# Patient Record
Sex: Female | Born: 1995
Health system: Southern US, Community
[De-identification: ages and names within clinical notes are randomized; demographics above are authoritative.]

## PROBLEM LIST (undated history)

## (undated) DIAGNOSIS — O365990 Maternal care for other known or suspected poor fetal growth, unspecified trimester, not applicable or unspecified: Secondary | ICD-10-CM

## (undated) DIAGNOSIS — O409XX0 Polyhydramnios, unspecified trimester, not applicable or unspecified: Secondary | ICD-10-CM

## (undated) DIAGNOSIS — F329 Major depressive disorder, single episode, unspecified: Secondary | ICD-10-CM

## (undated) DIAGNOSIS — R109 Unspecified abdominal pain: Secondary | ICD-10-CM

## (undated) DIAGNOSIS — T8859XA Other complications of anesthesia, initial encounter: Secondary | ICD-10-CM

## (undated) DIAGNOSIS — F419 Anxiety disorder, unspecified: Secondary | ICD-10-CM

## (undated) DIAGNOSIS — T4145XA Adverse effect of unspecified anesthetic, initial encounter: Secondary | ICD-10-CM

## (undated) DIAGNOSIS — F32A Depression, unspecified: Secondary | ICD-10-CM

## (undated) DIAGNOSIS — J45909 Unspecified asthma, uncomplicated: Secondary | ICD-10-CM

## (undated) HISTORY — PX: ADENOIDECTOMY: SUR15

## (undated) HISTORY — PX: TYMPANOSTOMY TUBE PLACEMENT: SHX32

## (undated) HISTORY — DX: Other complications of anesthesia, initial encounter: T88.59XA

## (undated) HISTORY — DX: Depression, unspecified: F32.A

## (undated) HISTORY — DX: Unspecified abdominal pain: R10.9

---
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
## 2019-03-02 ENCOUNTER — Other Ambulatory Visit: Payer: Self-pay

## 2019-03-02 ENCOUNTER — Ambulatory Visit (INDEPENDENT_AMBULATORY_CARE_PROVIDER_SITE_OTHER): Payer: Medicaid Other | Admitting: Allergy and Immunology

## 2019-03-02 ENCOUNTER — Encounter: Payer: Self-pay | Admitting: Allergy and Immunology

## 2019-03-02 VITALS — BP 136/80 | HR 94 | Temp 98.3°F | Resp 18 | Ht 62.0 in | Wt 178.4 lb

## 2019-03-02 DIAGNOSIS — J4540 Moderate persistent asthma, uncomplicated: Secondary | ICD-10-CM

## 2019-03-02 DIAGNOSIS — Z3A01 Less than 8 weeks gestation of pregnancy: Secondary | ICD-10-CM

## 2019-03-02 DIAGNOSIS — J3089 Other allergic rhinitis: Secondary | ICD-10-CM

## 2019-03-02 MED ORDER — BUDESONIDE 32 MCG/ACT NA SUSP: NASAL | 5 refills | Status: DC

## 2019-03-02 MED ORDER — PULMICORT FLEXHALER 180 MCG/ACT IN AEPB: INHALATION_SPRAY | RESPIRATORY_TRACT | 5 refills | Status: DC

## 2019-03-02 MED ORDER — MONTELUKAST SODIUM 10 MG PO TABS: ORAL_TABLET | ORAL | 5 refills | Status: DC

## 2019-03-02 NOTE — Progress Notes (Signed)
[place] - [place] - [place] - [place] - [place]   Follow-up Note  Referring Provider: Charlene Brooke, MD Primary Provider: Charlene Brooke, MD Date of Office Visit: 03/02/2019  Subjective:   Tracy [name] (DOB: 1995/06/22) is a 23 y.o. female who [facility] in re-evaluation of the following:  HPI: Tracy [name] presents to this clinic in evaluation of breathing problems.  I last saw her in his clinic for asthma and allergic rhinitis and a history of reflux with her last visit being 21 March 2016.  She states that she has really done very well while using a bronchodilator twice a day on a consistent basis for many years along with Singulair.  She states that her nose has been doing pretty well as long as she continues to use Singulair.  About every spring and every fall it sounds as though she gets a steroid injection or prednisone for a respiratory tract flare.  She no longer uses Symbicort or nasal steroid.  Sometime in mid September she developed significant wheezing and coughing which lasted about 5 days or so and fortunately improved but over the course of the past month she has had very consistent wheezing and coughing and shortness of breath and using her bronchodilator multiple times per day and she ended up in the emergency room 5 days ago with these symptoms along with significant posttussive emesis and was treated with a systemic steroid and prednisone.  She is much better at this point in time.  She has not been having any issues with reflux.  She is presently [redacted] weeks pregnant.  She has an appointment to see her gynecologist on 23 March 2019.  Allergies as of 03/02/2019   No Known Allergies     Medication List      ProAir HFA 108 (90 Base) MCG/ACT inhaler Generic drug: albuterol Inhale 2 puffs into the lungs every 4 (four) hours as needed for wheezing or shortness of breath.   albuterol (2.5 MG/3ML) 0.083%  nebulizer solution Commonly known as: PROVENTIL Take 2.5 mg by nebulization every 6 (six) hours as needed for wheezing or shortness of breath.   ketoconazole 2 % shampoo Commonly known as: NIZORAL U SHAMPOO QOD UTD       Past Medical History:  Diagnosis Date  . Abdominal pain   . Anxiety   . Asthma     Past Surgical History:  Procedure Laterality Date  . ADENOIDECTOMY    . ESOPHAGOGASTRODUODENOSCOPY N/A 08/12/2013   Procedure: ESOPHAGOGASTRODUODENOSCOPY (EGD);  Surgeon: Jon Gills, MD;  Location: [facility] ENDOSCOPY;  Service: Endoscopy;  Laterality: N/A;  . TYMPANOSTOMY TUBE PLACEMENT      Review of systems negative except as noted in HPI / PMHx or noted below:  Review of Systems  Constitutional: Negative.   HENT: Negative.   Eyes: Negative.   Respiratory: Negative.   Cardiovascular: Negative.   Gastrointestinal: Negative.   Genitourinary: Negative.   Musculoskeletal: Negative.   Skin: Negative.   Neurological: Negative.   Endo/Heme/Allergies: Negative.   Psychiatric/Behavioral: Negative.      Objective:   Vitals:   03/02/19 1616  BP: 136/80  Pulse: 94  Resp: 18  Temp: 98.3 F (36.8 C)  SpO2: 96%   Height: 5\' 2"  (157.5 cm)  Weight: 178 lb 6.4 oz (80.9 kg)   Physical Exam Constitutional:      Appearance: She is not diaphoretic.  HENT:     Head: Normocephalic.  Right Ear: Tympanic membrane, ear canal and external ear normal.     Left Ear: Tympanic membrane, ear canal and external ear normal.     Nose: Nose normal. No mucosal edema or rhinorrhea.     Mouth/Throat:     Pharynx: Uvula midline. No oropharyngeal exudate.  Eyes:     Conjunctiva/sclera: Conjunctivae normal.  Neck:     Thyroid: No thyromegaly.     Trachea: Trachea normal. No tracheal tenderness or tracheal deviation.  Cardiovascular:     Rate and Rhythm: Normal rate and regular rhythm.     Heart sounds: Normal heart sounds, S1 normal and S2 normal. No murmur.  Pulmonary:     Effort:  No respiratory distress.     Breath sounds: Normal breath sounds. No stridor. No wheezing or rales.  Lymphadenopathy:     Head:     Right side of head: No tonsillar adenopathy.     Left side of head: No tonsillar adenopathy.     Cervical: No cervical adenopathy.  Skin:    Findings: No erythema or rash.     Nails: There is no clubbing.   Neurological:     Mental Status: She is alert.     Diagnostics:    Spirometry was performed and demonstrated an FEV1 of 2.64 at 85 % of predicted.  Assessment and Plan:   1. Not well controlled moderate persistent asthma   2. Perennial allergic rhinitis   3. Less than [redacted] weeks gestation of pregnancy     1.  Start Pulmicort 180-2 inhalations twice a day  2.  Start nasal budesonide-1 spray each nostril 1 time per day  3.  Continue montelukast 10 mg - 1 tablet 1 time per day  4.  If needed:   A. ProAir HFA 2 inhalations or nebulization every 4-6 hours  B.  OTC antihistamine - loratadine 10 mg -1 tablet 1 time per day  5.  Return to clinic in 4 weeks or earlier if problem.  Taper medications?  6.  Obtain flu vaccine as directed by gynecologist  Tracy [name] obviously does not have good control of her asthma as she has been using a short acting bronchodilator twice a day for years and recently required a systemic steroid and a history of history of requiring a systemic steroid at least twice a year.  I am going to treat her with Pulmicort/budesonide for both her upper and lower airway and she can continue on a leukotriene modifier.  I will see her back in his clinic in 4 weeks or earlier if there is a problem.  Tracy Katz, MD Allergy / Immunology [place]

## 2019-03-02 NOTE — Patient Instructions (Addendum)
  1.  Start Pulmicort 180-2 inhalations twice a day  2.  Start nasal budesonide-1 spray each nostril 1 time per day  3.  Continue montelukast 10 mg - 1 tablet 1 time per day  4.  If needed:   A. ProAir HFA 2 inhalations or nebulization every 4-6 hours  B.  OTC antihistamine - loratadine 10 mg -1 tablet 1 time per day  5.  Return to clinic in 4 weeks or earlier if problem.  Taper medications?  6.  Obtain flu vaccine as directed by gynecologist

## 2019-03-03 ENCOUNTER — Encounter: Payer: Self-pay | Admitting: Allergy and Immunology

## 2019-03-04 ENCOUNTER — Telehealth: Payer: Self-pay | Admitting: *Deleted

## 2019-03-04 NOTE — Telephone Encounter (Signed)
Tracy [name] is calling complaining of hives and itching. She denies all other symptoms such as swelling or breathing difficulty.  She states that she has never had hives before and that she started Pulmicort on Wednesday per Dr. Bruna Potter order. She is concerned that this is the cause but I spoke with Dr. Neldon Mc and he believes this is most likely because of her pregnancy. He states that she can take Zyrtec. I have informed Tracy [name] of this and told her to call back if her symptoms change or get worse.

## 2019-03-09 ENCOUNTER — Encounter: Payer: Self-pay | Admitting: *Deleted

## 2019-03-10 ENCOUNTER — Telehealth: Payer: Self-pay | Admitting: Emergency Medicine

## 2019-03-10 NOTE — Telephone Encounter (Signed)
Pt called and left a message on the nurse voicemail line stating that she needs the mychart link resent so she can download it and be ready for her appointment.

## 2019-03-17 NOTE — Telephone Encounter (Signed)
I called [place] and explained her first visit with [place] is a telephone visit ; but I can send her a MyChart text if she would like so she can get her MyChart account started. She states she would like me to send text. I sent text and explained how to set up account and download app.  Mieshia Pepitone,RN

## 2019-03-21 ENCOUNTER — Telehealth: Payer: Self-pay | Admitting: Family Medicine

## 2019-03-21 NOTE — Telephone Encounter (Signed)
Spoke to patient about her appointment on 11/18 @ 8:30. Patient instructed that this visit will be a phone visit and she does not have to come to the office for this appointment. Patient instructed a nurse will be call her around her appointment time. Patient instructed to be available around her appointment. Patient verbalized understanding.

## 2019-03-23 ENCOUNTER — Other Ambulatory Visit: Payer: Self-pay

## 2019-03-23 ENCOUNTER — Ambulatory Visit (INDEPENDENT_AMBULATORY_CARE_PROVIDER_SITE_OTHER): Payer: Medicaid Other | Admitting: *Deleted

## 2019-03-23 DIAGNOSIS — O99210 Obesity complicating pregnancy, unspecified trimester: Secondary | ICD-10-CM

## 2019-03-23 DIAGNOSIS — Z3490 Encounter for supervision of normal pregnancy, unspecified, unspecified trimester: Secondary | ICD-10-CM

## 2019-03-23 DIAGNOSIS — F329 Major depressive disorder, single episode, unspecified: Secondary | ICD-10-CM | POA: Insufficient documentation

## 2019-03-23 DIAGNOSIS — J45909 Unspecified asthma, uncomplicated: Secondary | ICD-10-CM | POA: Insufficient documentation

## 2019-03-23 DIAGNOSIS — F3289 Other specified depressive episodes: Secondary | ICD-10-CM

## 2019-03-23 DIAGNOSIS — F32A Depression, unspecified: Secondary | ICD-10-CM | POA: Insufficient documentation

## 2019-03-23 DIAGNOSIS — F419 Anxiety disorder, unspecified: Secondary | ICD-10-CM | POA: Insufficient documentation

## 2019-03-23 MED ORDER — BLOOD PRESSURE KIT DEVI: 1.0000 | 0 refills | Status: DC | PRN

## 2019-03-23 NOTE — Patient Instructions (Signed)

## 2019-03-23 NOTE — Progress Notes (Signed)
I connected with  Tracy [name] on 03/23/19 at  8:30 AM EST by telephone and verified that I am speaking with the correct person using two identifiers.   I discussed the limitations, risks, security and privacy concerns of performing an evaluation and management service by telephone and the availability of in person appointments. I also discussed with the patient that there may be a patient responsible charge related to this service. The patient expressed understanding and agreed to proceed. Explained I am completing her New OB Intake today. We discussed Her EDD and that it is based on  sure LMP . I reviewed her allergies, meds, OB History, Medical /Surgical history, and appropriate screenings. I explained I will send her the [employer] app- app sent to her while on phone.  I explained we will send a blood pressure cuff to [facility] that will fill that prescription and they  will call her to verify her information. I asked her to bring the blood pressure cuff with her to her first ob appointment so we can show her how to use it. Explained  then we will have her take her blood pressure weekly and enter into the app. Explained she will have some visits in office and some virtually. She already has Community education officer. Reviewed appointment date/ time with her , our location and to wear mask, no visitors. Explained she will have exam, ob bloodwork, hemoglobin a1C, cbg , genetic testing if desired, pap if needed. I scheduled an [place] at 19 weeks and gave her the appointment. She voices understanding.   Linda,RN 03/23/2019  8:27 AM

## 2019-03-30 ENCOUNTER — Other Ambulatory Visit: Payer: Self-pay

## 2019-03-30 ENCOUNTER — Encounter: Payer: Self-pay | Admitting: Allergy and Immunology

## 2019-03-30 ENCOUNTER — Ambulatory Visit (INDEPENDENT_AMBULATORY_CARE_PROVIDER_SITE_OTHER): Payer: Medicaid Other | Admitting: Allergy and Immunology

## 2019-03-30 VITALS — BP 104/60 | HR 88 | Temp 97.2°F | Resp 18

## 2019-03-30 DIAGNOSIS — J4540 Moderate persistent asthma, uncomplicated: Secondary | ICD-10-CM

## 2019-03-30 DIAGNOSIS — J3089 Other allergic rhinitis: Secondary | ICD-10-CM | POA: Diagnosis not present

## 2019-03-30 MED ORDER — ALBUTEROL SULFATE HFA 108 (90 BASE) MCG/ACT IN AERS: INHALATION_SPRAY | RESPIRATORY_TRACT | 1 refills | Status: DC

## 2019-03-30 MED ORDER — ASMANEX HFA 100 MCG/ACT IN AERO: 2.0000 | INHALATION_SPRAY | Freq: Every day | RESPIRATORY_TRACT | 5 refills | Status: DC

## 2019-03-30 MED ORDER — MONTELUKAST SODIUM 10 MG PO TABS: ORAL_TABLET | ORAL | 5 refills | Status: DC

## 2019-03-30 MED ORDER — LORATADINE 10 MG PO TABS: ORAL_TABLET | ORAL | 5 refills | Status: DC

## 2019-03-30 NOTE — Patient Instructions (Signed)
  1.  Start Asmanex HFA 100 -2 inhalations once a day  2.  Continue montelukast 10 mg - 1 tablet 1 time per day  3.  If needed:   A. ProAir HFA 2 inhalations or nebulization every 4-6 hours  B.  OTC antihistamine - loratadine 10 mg -1 tablet 1 time per day  4.  Return to clinic in 12 weeks or earlier if problem

## 2019-03-30 NOTE — Progress Notes (Signed)
[place] - [place] - [place]   Follow-up Note  Referring Provider: Cherene Altes, MD Primary Provider: Cherene Altes, MD Date of Office Visit: 03/30/2019  Subjective:   Tracy [name] (DOB: May 17, 1995) is a 23 y.o. female who returns to the [facility] [place] on 03/30/2019 in re-evaluation of the following:  HPI: Tracy [name] returns to this clinic in reevaluation of asthma in the setting of pregnancy.  I last saw her in this clinic on 02 March 2019 at which point in time she did not appear to have very well controlled asthma and we started her on Pulmicort and for some of his rhinitis issues we started her on Rhinocort.  She states that she had rather significant anxiety when using Pulmicort.  She basically had a panic attack after just a few doses.  Interestingly, around that point in time she developed a diffuse urticarial reaction requiring her to go to the emergency room and received an injection of systemic steroids and a course of prednisone.  As a result of that steroid treatment she has no problems with her breathing at this point.  States she still uses a short acting bronchodilator at nighttime but that is more of a habit than it is her need.  She continues to use montelukast every day.  Allergies as of 03/30/2019   No Known Allergies     Medication List    albuterol 108 (90 Base) MCG/ACT inhaler Commonly known as: ProAir HFA Can inhale two puffs every four to six hours as needed for cough or wheeze.   albuterol (2.5 MG/3ML) 0.083% nebulizer solution Commonly known as: PROVENTIL Take 2.5 mg by nebulization every 6 (six) hours as needed for wheezing or shortness of breath.   Blood Pressure Kit Devi 1 Device by Does not apply route as needed.   Diclegis 10-10 MG Tbec Generic drug: Doxylamine-Pyridoxine Take 1 tablet by mouth 2 (two) times daily.   doxylamine (Sleep) 25 MG tablet Commonly known as: UNISOM Take 25 mg by mouth at  bedtime as needed.   ketoconazole 2 % shampoo Commonly known as: NIZORAL U SHAMPOO QOD UTD   loratadine 10 MG tablet Commonly known as: CLARITIN Can take one tablet by mouth once daily if needed. Started by: Jiles Prows, MD   montelukast 10 MG tablet Commonly known as: SINGULAIR Take 1 tablet by mouth once daily   PRENATAL VITAMIN PO Take 1 tablet by mouth daily.   promethazine 25 MG tablet Commonly known as: PHENERGAN Take 25 mg by mouth every 6 (six) hours as needed for nausea or vomiting. Taking 1/2 tab usually   sertraline 50 MG tablet Commonly known as: ZOLOFT Take 50 mg by mouth daily.       Past Medical History:  Diagnosis Date  . Abdominal pain   . Anxiety   . Asthma   . Complication of anesthesia    panic attack   . Depression     Past Surgical History:  Procedure Laterality Date  . ADENOIDECTOMY    . CHOLECYSTECTOMY  05/2016  . ESOPHAGOGASTRODUODENOSCOPY N/A 08/12/2013   Procedure: ESOPHAGOGASTRODUODENOSCOPY (EGD);  Surgeon: Oletha Blend, MD;  Location: [facility] ENDOSCOPY;  Service: Endoscopy;  Laterality: N/A;  . TYMPANOSTOMY TUBE PLACEMENT      Review of systems negative except as noted in HPI / PMHx or noted below:  Review of Systems  Constitutional: Negative.   HENT: Negative.   Eyes: Negative.   Respiratory: Negative.   Cardiovascular:  Negative.   Gastrointestinal: Negative.   Genitourinary: Negative.   Musculoskeletal: Negative.   Skin: Negative.   Neurological: Negative.   Endo/Heme/Allergies: Negative.   Psychiatric/Behavioral: Negative.      Objective:   Vitals:   03/30/19 1519  BP: 104/60  Pulse: 88  Resp: 18  Temp: (!) 97.2 F (36.2 C)  SpO2: 98%          Physical Exam Constitutional:      Appearance: She is not diaphoretic.  HENT:     Head: Normocephalic.     Right Ear: Tympanic membrane, ear canal and external ear normal.     Left Ear: Tympanic membrane, ear canal and external ear normal.     Nose: Nose normal.  No mucosal edema or rhinorrhea.     Mouth/Throat:     Pharynx: Uvula midline. No oropharyngeal exudate.  Eyes:     Conjunctiva/sclera: Conjunctivae normal.  Neck:     Thyroid: No thyromegaly.     Trachea: Trachea normal. No tracheal tenderness or tracheal deviation.  Cardiovascular:     Rate and Rhythm: Normal rate and regular rhythm.     Heart sounds: Normal heart sounds, S1 normal and S2 normal. No murmur.  Pulmonary:     Effort: No respiratory distress.     Breath sounds: Normal breath sounds. No stridor. No wheezing or rales.  Lymphadenopathy:     Head:     Right side of head: No tonsillar adenopathy.     Left side of head: No tonsillar adenopathy.     Cervical: No cervical adenopathy.  Skin:    Findings: No erythema or rash.     Nails: There is no clubbing.   Neurological:     Mental Status: She is alert.     Diagnostics:    Spirometry was performed and demonstrated an FEV1 of 2.57 at 83 % of predicted.  The patient had an Asthma Control Test with the following results: ACT Total Score: 20.    Assessment and Plan:   1. Not well controlled moderate persistent asthma   2. Perennial allergic rhinitis     1.  Start Asmanex HFA 100 -2 inhalations once a day  2.  Continue montelukast 10 mg - 1 tablet 1 time per day  3.  If needed:   A. ProAir HFA 2 inhalations or nebulization every 4-6 hours  B.  OTC antihistamine - loratadine 10 mg -1 tablet 1 time per day  4.  Return to clinic in 12 weeks or earlier if problem  Tracy [name] appears to be doing quite well at this point time regarding her airway.  Of course, this occurs in the context of receiving systemic steroids from the emergency room for her pruritic disorder which fortunately is not active at this point.  I would like for her to use a preventative controller agent for her asthma and I given her a sample of Asmanex to use.  We will see how things go over the course of the next 12 weeks while utilizing this agent.   Allena Katz, MD Allergy / Immunology [place]

## 2019-03-30 NOTE — BH Specialist Note (Signed)
Integrated Behavioral Health via Telemedicine Video Visit  03/30/2019 ADALEENA [name] [id number]  Number of Integrated Behavioral Health visits: 1 Session Start time: 8:21  Session End time: 8:42 Total time: 21  Referring Provider: Thressa Sheller, CNM Type of Visit: Video Patient/Family location: Home [facility] Provider location: [facility] All persons participating in visit: Patient Tracy [name] and [facility] Tracy [name]    Confirmed patient's address: Yes  Confirmed patient's phone number: Yes  Any changes to demographics: No   Confirmed patient's insurance: Yes  Any changes to patient's insurance: No   Discussed confidentiality: Yes   I connected with [name] by a video enabled telemedicine application and verified that I am speaking with the correct person using two identifiers.     I discussed the limitations of evaluation and management by telemedicine and the availability of in person appointments.  I discussed that the purpose of this visit is to provide behavioral health care while limiting exposure to the novel coronavirus.   Discussed there is a possibility of technology failure and discussed alternative modes of communication if that failure occurs.  I discussed that engaging in this video visit, they consent to the provision of behavioral healthcare and the services will be billed under their insurance.  Patient and/or legal guardian expressed understanding and consented to video visit: Yes   PRESENTING CONCERNS: Patient and/or family reports the following symptoms/concerns: Pt states her primary concern today is escalating anxiety in pregnancy, attributed to feeling sick with nausea first trimester. Pt will "wake up with stomach in knots" which triggers feelings of panic. Pt is currently attending [facility] therapy monthly, is taking Zoloft.  Duration of problem: Increase in pregnancy; Severity of problem: severe  STRENGTHS (Protective Factors/Coping  Skills): Attending therapy, taking BH medication, open to learning new skills  GOALS ADDRESSED: Patient will: 1.  Reduce symptoms of: anxiety and depression  2.  Increase knowledge and/or ability of: coping skills  3.  Demonstrate ability to: Increase healthy adjustment to current life circumstances  INTERVENTIONS: Interventions utilized:  Mindfulness or Management consultant and Psychoeducation and/or Health Education Standardized Assessments completed: Took in past week  ASSESSMENT: Patient currently experiencing Adjustment disorder with mixed anxious and depressed mood.   Patient may benefit from psychoeducation and brief therapeutic interventions regarding coping with symptoms of anxiety and depression .  PLAN: 1. Follow up with behavioral health clinician on : One month (to check symptoms after nausea should decrease) 2. Behavioral recommendations:  -Continue attending routine therapy at [facility] -Continue taking Zoloft and prenatal vitamins as prescribed by medical provider -Consider using CALM relaxation breathing exercise prior to bedtime and upon waking to help prevent and reduce tension in body triggering panic 3. Referral(s): Integrated [employer] (In Clinic)  I discussed the assessment and treatment plan with the patient and/or parent/guardian. They were provided an opportunity to ask questions and all were answered. They agreed with the plan and demonstrated an understanding of the instructions.   They were advised to call back or seek an in-person evaluation if the symptoms worsen or if the condition fails to improve as anticipated.  Tracy [name] Tracy [name]  Depression screen [facility] 2/9 04/04/2019 03/23/2019  Decreased Interest 1 1  Down, Depressed, Hopeless 1 1  PHQ - 2 Score 2 2  Altered sleeping 3 3  Tired, decreased energy 3 3  Change in appetite 2 1  Feeling bad or failure about yourself  0 0  Trouble concentrating 0 1  Moving slowly or fidgety/restless  0 0  Suicidal thoughts 0 0  PHQ-9 Score 10 10   GAD 7 : Generalized Anxiety Score 04/04/2019 03/23/2019  Nervous, Anxious, on Edge 3 3  Control/stop worrying 3 1  Worry too much - different things 3 1  Trouble relaxing 3 1  Restless 3 1  Easily annoyed or irritable 3 3  Afraid - awful might happen 3 1  Total GAD 7 Score 21 11

## 2019-04-04 ENCOUNTER — Telehealth: Payer: Self-pay

## 2019-04-04 ENCOUNTER — Encounter: Payer: Self-pay | Admitting: Allergy and Immunology

## 2019-04-04 ENCOUNTER — Ambulatory Visit (INDEPENDENT_AMBULATORY_CARE_PROVIDER_SITE_OTHER): Payer: Medicaid Other | Admitting: Advanced Practice Midwife

## 2019-04-04 ENCOUNTER — Other Ambulatory Visit: Payer: Self-pay

## 2019-04-04 ENCOUNTER — Other Ambulatory Visit (HOSPITAL_COMMUNITY)
Admission: RE | Admit: 2019-04-04 | Discharge: 2019-04-04 | Disposition: A | Discharge status: Home / Self Care | Payer: Medicaid Other | Source: Ambulatory Visit | Attending: Advanced Practice Midwife | Admitting: Advanced Practice Midwife

## 2019-04-04 VITALS — BP 124/77 | HR 88 | Wt 175.5 lb

## 2019-04-04 DIAGNOSIS — F419 Anxiety disorder, unspecified: Secondary | ICD-10-CM

## 2019-04-04 DIAGNOSIS — J45909 Unspecified asthma, uncomplicated: Secondary | ICD-10-CM

## 2019-04-04 DIAGNOSIS — Z3A11 11 weeks gestation of pregnancy: Secondary | ICD-10-CM

## 2019-04-04 DIAGNOSIS — F1210 Cannabis abuse, uncomplicated: Secondary | ICD-10-CM | POA: Insufficient documentation

## 2019-04-04 DIAGNOSIS — O99341 Other mental disorders complicating pregnancy, first trimester: Secondary | ICD-10-CM

## 2019-04-04 DIAGNOSIS — Z3490 Encounter for supervision of normal pregnancy, unspecified, unspecified trimester: Secondary | ICD-10-CM | POA: Insufficient documentation

## 2019-04-04 DIAGNOSIS — Z23 Encounter for immunization: Secondary | ICD-10-CM | POA: Diagnosis not present

## 2019-04-04 DIAGNOSIS — Z3491 Encounter for supervision of normal pregnancy, unspecified, first trimester: Secondary | ICD-10-CM

## 2019-04-04 DIAGNOSIS — O99211 Obesity complicating pregnancy, first trimester: Secondary | ICD-10-CM

## 2019-04-04 DIAGNOSIS — O99210 Obesity complicating pregnancy, unspecified trimester: Secondary | ICD-10-CM

## 2019-04-04 MED ORDER — PYRIDOXINE HCL 25 MG PO TABS: 25.0000 mg | ORAL_TABLET | Freq: Three times a day (TID) | ORAL | 0 refills | Status: DC

## 2019-04-04 MED ORDER — DOXYLAMINE SUCCINATE (SLEEP) 25 MG PO TABS: 25.0000 mg | ORAL_TABLET | Freq: Every evening | ORAL | 3 refills | Status: DC | PRN

## 2019-04-04 MED ORDER — ASPIRIN EC 81 MG PO TBEC: 81.0000 mg | DELAYED_RELEASE_TABLET | Freq: Every day | ORAL | 2 refills | Status: DC

## 2019-04-04 NOTE — Telephone Encounter (Signed)
Faxed documentation to [facility] [employer] for prior authorization of Asmanex 100 HFA.  Awaiting response.

## 2019-04-04 NOTE — Patient Instructions (Signed)
Second Trimester of Pregnancy  The second trimester is from week 14 through week 27 (month 4 through 6). This is often the time in pregnancy that you feel your best. Often times, morning sickness has lessened or quit. You may have more energy, and you may get hungry more often. Your unborn baby is growing rapidly. At the end of the sixth month, he or she is about 9 inches long and weighs about 1 pounds. You will likely feel the baby move between 18 and 20 weeks of pregnancy.   Follow these instructions at home: Medicines  Take over-the-counter and prescription medicines only as told by your doctor. Some medicines are safe and some medicines are not safe during pregnancy.  Take a prenatal vitamin that contains at least 600 micrograms (mcg) of folic acid.  If you have trouble pooping (constipation), take medicine that will make your stool soft (stool softener) if your doctor approves. Eating and drinking   Eat regular, healthy meals.  Avoid raw meat and uncooked cheese.  If you get low calcium from the food you eat, talk to your doctor about taking a daily calcium supplement.  Avoid foods that are high in fat and sugars, such as fried and sweet foods.  If you feel sick to your stomach (nauseous) or throw up (vomit): ? Eat 4 or 5 small meals a day instead of 3 large meals. ? Try eating a few soda crackers. ? Drink liquids between meals instead of during meals.  To prevent constipation: ? Eat foods that are high in fiber, like fresh fruits and vegetables, whole grains, and beans. ? Drink enough fluids to keep your pee (urine) clear or pale yellow. Activity  Exercise only as told by your doctor. Stop exercising if you start to have cramps.  Do not exercise if it is too hot, too humid, or if you are in a place of great height (high altitude).  Avoid heavy lifting.  Wear low-heeled shoes. Sit and stand up straight.  You can continue to have sex unless your doctor tells you not to.  Relieving pain and discomfort  Wear a good support bra if your breasts are tender.  Take warm water baths (sitz baths) to soothe pain or discomfort caused by hemorrhoids. Use hemorrhoid cream if your doctor approves.  Rest with your legs raised if you have leg cramps or low back pain.  If you develop puffy, bulging veins (varicose veins) in your legs: ? Wear support hose or compression stockings as told by your doctor. ? Raise (elevate) your feet for 15 minutes, 3-4 times a day. ? Limit salt in your food. Prenatal care  Write down your questions. Take them to your prenatal visits.  Keep all your prenatal visits as told by your doctor. This is important. Safety  Wear your seat belt when driving.  Make a list of emergency phone numbers, including numbers for family, friends, the hospital, and police and fire departments. General instructions  Ask your doctor about the right foods to eat or for help finding a counselor, if you need these services.  Ask your doctor about local prenatal classes. Begin classes before month 6 of your pregnancy.  Do not use hot tubs, steam rooms, or saunas.  Do not douche or use tampons or scented sanitary pads.  Do not cross your legs for long periods of time.  Visit your dentist if you have not done so. Use a soft toothbrush to brush your teeth. Floss gently.  Avoid all  smoking, herbs, and alcohol. Avoid drugs that are not approved by your doctor.  Do not use any products that contain nicotine or tobacco, such as cigarettes and e-cigarettes. If you need help quitting, ask your doctor.  Avoid cat litter boxes and soil used by cats. These carry germs that can cause birth defects in the baby and can cause a loss of your baby (miscarriage) or stillbirth. Contact a doctor if:  You have mild cramps or pressure in your lower belly.  You have pain when you pee (urinate).  You have bad smelling fluid coming from your vagina.  You continue to feel  sick to your stomach (nauseous), throw up (vomit), or have watery poop (diarrhea).  You have a nagging pain in your belly area.  You feel dizzy. Get help right away if:  You have a fever.  You are leaking fluid from your vagina.  You have spotting or bleeding from your vagina.  You have severe belly cramping or pain.  You lose or gain weight rapidly.  You have trouble catching your breath and have chest pain.  You notice sudden or extreme puffiness (swelling) of your face, hands, ankles, feet, or legs.  You have not felt the baby move in over an hour.  You have severe headaches that do not go away when you take medicine.  You have trouble seeing. Summary  The second trimester is from week 14 through week 27 (months 4 through 6). This is often the time in pregnancy that you feel your best.  To take care of yourself and your unborn baby, you will need to eat healthy meals, take medicines only if your doctor tells you to do so, and do activities that are safe for you and your baby.  Call your doctor if you get sick or if you notice anything unusual about your pregnancy. Also, call your doctor if you need help with the right food to eat, or if you want to know what activities are safe for you. This information is not intended to replace advice given to you by your health care provider. Make sure you discuss any questions you have with your health care provider. Document Released: 07/16/2009 Document Revised: 08/13/2018 Document Reviewed: 05/27/2016 Elsevier Patient Education  [place] Medications in Pregnancy   Acne: Benzoyl Peroxide Salicylic Acid  Backache/Headache: Tylenol: 2 regular strength every 4 hours OR              2 Extra strength every 6 hours  Colds/Coughs/Allergies: Benadryl (alcohol free) 25 mg every 6 hours as needed Breath right strips Claritin Cepacol throat lozenges Chloraseptic throat spray Cold-Eeze- up to three times per day Cough  drops, alcohol free Flonase (by prescription only) Guaifenesin Mucinex Robitussin DM (plain only, alcohol free) Saline nasal spray/drops Sudafed (pseudoephedrine) & Actifed ** use only after [redacted] weeks gestation and if you do not have high blood pressure Tylenol Vicks Vaporub Zinc lozenges Zyrtec   Constipation: Colace Ducolax suppositories Fleet enema Glycerin suppositories Metamucil Milk of magnesia Miralax Senokot Smooth move tea  Diarrhea: Kaopectate Imodium A-D  *NO pepto Bismol  Hemorrhoids: Anusol Anusol HC Preparation H Tucks  Indigestion: Tums Maalox Mylanta Zantac  Pepcid  Insomnia: Benadryl (alcohol free) 25mg  every 6 hours as needed Tylenol PM Unisom, no Gelcaps  Leg Cramps: Tums MagGel  Nausea/Vomiting:  Bonine Dramamine Emetrol Ginger extract Sea bands Meclizine  Nausea medication to take during pregnancy:  Unisom (doxylamine succinate 25 mg tablets) Take one tablet daily at  bedtime. If symptoms are not adequately controlled, the dose can be increased to a maximum recommended dose of two tablets daily (1/2 tablet in the morning, 1/2 tablet mid-afternoon and one at bedtime). Vitamin B6 100mg  tablets. Take one tablet twice a day (up to 200 mg per day).  Skin Rashes: Aveeno products Benadryl cream or 25mg  every 6 hours as needed Calamine Lotion 1% cortisone cream  Yeast infection: Gyne-lotrimin 7 Monistat 7   **If taking multiple medications, please check labels to avoid duplicating the same active ingredients **take medication as directed on the label ** Do not exceed 4000 mg of tylenol in 24 hours **Do not take medications that contain aspirin or ibuprofen

## 2019-04-04 NOTE — Progress Notes (Signed)
History:   KONYA FAUBLE is a 23 y.o. Y8M5 at 97w6dby certain LMP being seen today for her first obstetrical visit.  Her obstetrical history is significant for obesity and THC use. Patient does intend to breast feed. Pregnancy history fully reviewed.  Patient reports recurrent nausea which she is managing with Diclegis and Phenergan. She also endorses new onset mild abdominal cramping which began Saturday 04/02/19 and resolved without intervention.  Patient is tearful throughout her appointment today. She states she has preexisting anxiety and the sense of feeling overwhelmed by this unplanned pregnancy is causing her significant stress.  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 This is a reasonably new position which she started about two months ago. She lives with her husband CDarrick Meigs who is very excited about the pregnancy and supportive. She denies SI, HI, IPV.  Patient endorses chronic THC use which she uses for anxiety management. She is aware of Cannabis Hyperemesis and states she has been trying to limit her use due to her recurrent nausea.      HISTORY: OB History  Gravida Para Term Preterm AB Living  1 0 0 0 0 0  SAB TAB Ectopic Multiple Live Births  0 0 0 0 0    # Outcome Date GA Lbr Len/2nd Weight Sex Delivery Anes PTL Lv  1 Current              Past Medical History:  Diagnosis Date  . Abdominal pain   . Anxiety   . Asthma   . Complication of anesthesia    panic attack   . Depression    Past Surgical History:  Procedure Laterality Date  . ADENOIDECTOMY    . CHOLECYSTECTOMY  05/2016  . ESOPHAGOGASTRODUODENOSCOPY N/A 08/12/2013   Procedure: ESOPHAGOGASTRODUODENOSCOPY (EGD);  Surgeon: JOletha Blend MD;  [facility];  Service: Endoscopy;  Laterality: N/A;  . TYMPANOSTOMY TUBE PLACEMENT     Family History  Problem Relation Age of Onset  . Nephrolithiasis Father   . Depression Father   . Depression Mother   . Anxiety disorder  Mother   . Drug abuse Mother   . Celiac disease Neg Hx   . Ulcers Neg Hx   . Cholelithiasis Neg Hx    Social History   Tobacco Use  . Smoking status: Never Smoker  . Smokeless tobacco: Never Used  Substance Use Topics  . Alcohol use: Not Currently    Comment: rarely  . Drug use: No   No Known Allergies Current Outpatient Medications on File Prior to Visit  Medication Sig Dispense Refill  . albuterol (PROAIR HFA) 108 (90 Base) MCG/ACT inhaler Can inhale two puffs every four to six hours as needed for cough or wheeze. 18 g 1  . albuterol (PROVENTIL) (2.5 MG/3ML) 0.083% nebulizer solution Take 2.5 mg by nebulization every 6 (six) hours as needed for wheezing or shortness of breath.    . Doxylamine-Pyridoxine (DICLEGIS) 10-10 MG TBEC Take 1 tablet by mouth 2 (two) times daily.    .Marland Kitchenketoconazole (NIZORAL) 2 % shampoo U SHAMPOO QOD UTD  5  . montelukast (SINGULAIR) 10 MG tablet Take 1 tablet by mouth once daily 30 tablet 5  . Prenatal Vit-Fe Fumarate-FA (PRENATAL VITAMIN PO) Take 1 tablet by mouth daily.    . promethazine (PHENERGAN) 25 MG tablet Take 25 mg by mouth every 6 (six) hours as needed for nausea or vomiting. Taking 1/2 tab usually    . sertraline (ZOLOFT) 50  MG tablet Take 50 mg by mouth daily.    . Blood Pressure Monitoring (BLOOD PRESSURE KIT) DEVI 1 Device by Does not apply route as needed. (Patient not taking: Reported on 04/04/2019) 1 each 0  . loratadine (CLARITIN) 10 MG tablet Can take one tablet by mouth once daily if needed. (Patient not taking: Reported on 04/04/2019) 30 tablet 5  . Mometasone Furoate (ASMANEX HFA) 100 MCG/ACT AERO Inhale 2 puffs into the lungs daily. Rinse, gargle, and spit after use. (Patient not taking: Reported on 04/04/2019) 13 g 5   Current Facility-Administered Medications on File Prior to Visit  Medication Dose Route Frequency Provider Last Rate Last Dose  . Benralizumab SOSY 30 mg  30 mg Subcutaneous Q8 Weeks Kozlow, Donnamarie Poag, MD         Review of Systems Pertinent items noted in HPI and remainder of comprehensive ROS otherwise negative. Physical Exam:   Vitals:   04/04/19 0850  BP: 124/77  Pulse: 88  Weight: 175 lb 8 oz (79.6 kg)   Fetal Heart Rate (bpm): 159 Uterus:     Pelvic Exam: Perineum: no hemorrhoids, normal perineum   Vulva: normal external genitalia, no lesions   Vagina:  normal mucosa, normal discharge   Cervix: no lesions and normal, pap smear done.    Adnexa: normal adnexa and no mass, fullness, tenderness   Bony Pelvis: average  System: General: well-developed, well-nourished female in no acute distress   Breasts:  normal appearance, no masses or tenderness bilaterally   Skin: normal coloration and turgor, no rashes   Neurologic: oriented, normal, negative, normal mood   Extremities: normal strength, tone, and muscle mass, ROM of all joints is normal   HEENT PERRLA, extraocular movement intact and sclera clear, anicteric   Mouth/Teeth mucous membranes moist, pharynx normal without lesions and dental hygiene good   Neck supple and no masses   Cardiovascular: regular rate and rhythm   Respiratory:  no respiratory distress, normal breath sounds   Abdomen: soft, non-tender; bowel sounds normal; no masses,  no organomegaly     Assessment:    Pregnancy: G1P0 Patient Active Problem List   Diagnosis Date Noted  . Supervision of low-risk pregnancy 03/23/2019  . Obesity in pregnancy, antepartum 03/23/2019  . Depression   . Anxiety   . Asthma   . Allergic rhinoconjunctivitis 03/22/2015  . Moderate persistent asthma 03/22/2015  . Nausea alone 07/07/2013  . Substernal pain 07/06/2013  . Right sided abdominal pain      Indications for ASA therapy (per uptodate)  Two or more of the following: Nulliparity Yes Obesity (body mass index >30 kg/m2) Yes Family history of preeclampsia in mother or sister No Age ?35 years No Sociodemographic characteristics (African American race, low socioeconomic  level) Yes Personal risk factors (eg, previous pregnancy with low birth weight or small for gestational age infant, previous adverse pregnancy outcome [eg, stillbirth], interval >10 years between pregnancies) No  Indications for early 1 hour GTT (per uptodate)  BMI >25 (>23 in Asian women) AND one of the following  Gestational diabetes mellitus in a previous pregnancy No Glycated hemoglobin ?5.7 percent (39 mmol/mol), impaired glucose tolerance, or impaired fasting glucose on previous testing No First-degree relative with diabetes No High-risk race/ethnicity (eg, African American, Latino, Native American, [place] American, [place] Islander) No History of cardiovascular disease No Hypertension or on therapy for hypertension No High-density lipoprotein cholesterol level <35 mg/dL (0.90 mmol/L) and/or a triglyceride level >250 mg/dL (2.82 mmol/L) No Polycystic ovary syndrome No  Physical inactivity Yes Other clinical condition associated with insulin resistance (eg, severe obesity, acanthosis nigricans) No Previous birth of an infant weighing ?4000 g No Previous stillbirth of unknown cause No  Plan:    1. Encounter for supervision of low-risk pregnancy, antepartum - Welcomed to practice - Reassured that practice has resources to help patient manage her anxiety - Moved from previously prescribed Diclegis to B6+ Unisom - Continue Phenergan PRN - Culture, OB Urine - Obstetric Panel, Including HIV - Cytology - PAP( [facility]) - Genetic Screening - pyridOXINE (VITAMIN B-6) 25 MG tablet; Take 1 tablet (25 mg total) by mouth every 8 (eight) hours.  Dispense: 30 tablet; Refill: 0 - doxylamine, Sleep, (UNISOM) 25 MG tablet; Take 1 tablet (25 mg total) by mouth at bedtime as needed.  Dispense: 30 tablet; Refill: 3  2. Obesity in pregnancy - Target total weight gain 11-20 lbs - Protein / creatinine ratio, urine - Comprehensive metabolic panel - HgB L9F - aspirin EC 81 MG tablet; Take 1 tablet  (81 mg total) by mouth daily. Take after 12 weeks for prevention of preeclampsia later in pregnancy. Start taking daily on 04/05/2019  Dispense: 300 tablet; Refill: 2  3. Anxiety - Appointment with [facility] tomorrow 04/05/19 - Preemptive discussion of Vistaril as possible part of care plan PRN in pregnancy  4. Uncomplicated asthma, unspecified asthma severity, unspecified whether persistent - S/p visit with Allergy and Immunology 03/30/19 - No complaints or concerns today  5. Need for influenza vaccination  - Flu Vaccine QUAD 36+ mos IM  Initial labs drawn. Continue prenatal vitamins. Genetic Screening discussed, First trimester screen, Quad screen and NIPS: ordered. Ultrasound discussed; fetal anatomic survey: ordered. Problem list reviewed and updated. The nature of [facility] with multiple MDs and other Advanced Practice Providers was explained to patient; also emphasized that residents, students are part of our team. Routine obstetric precautions reviewed. Return in about 1 week (around 04/11/2019) for Return in about one week for early Glucola Return around 19 weeks for Virtual LOB.    Total visit time 45 minutes. Greater than 50% of visit spent in counseling and coordination of care  Mallie Snooks, MSN, CNM Certified Nurse Midwife, [employer] for [employer], [place] 04/04/19 10:02 AM

## 2019-04-04 NOTE — Progress Notes (Signed)
Medicaid home form completed at today's visit.   Kramer Hanrahan RN 04/04/19 

## 2019-04-05 ENCOUNTER — Ambulatory Visit (INDEPENDENT_AMBULATORY_CARE_PROVIDER_SITE_OTHER): Payer: Medicaid Other | Admitting: Clinical

## 2019-04-05 ENCOUNTER — Encounter: Payer: Self-pay | Admitting: *Deleted

## 2019-04-05 ENCOUNTER — Encounter: Payer: Self-pay | Admitting: Advanced Practice Midwife

## 2019-04-05 DIAGNOSIS — Z283 Underimmunization status: Secondary | ICD-10-CM | POA: Insufficient documentation

## 2019-04-05 DIAGNOSIS — F4323 Adjustment disorder with mixed anxiety and depressed mood: Secondary | ICD-10-CM

## 2019-04-05 DIAGNOSIS — Z2839 Other underimmunization status: Secondary | ICD-10-CM | POA: Insufficient documentation

## 2019-04-05 DIAGNOSIS — O09899 Supervision of other high risk pregnancies, unspecified trimester: Secondary | ICD-10-CM | POA: Insufficient documentation

## 2019-04-05 LAB — OBSTETRIC PANEL, INCLUDING HIV
Antibody Screen: NEGATIVE
Basophils Absolute: 0.1 10*3/uL (ref 0.0–0.2)
Basos: 1 %
EOS (ABSOLUTE): 0.3 10*3/uL (ref 0.0–0.4)
Eos: 2 %
HIV Screen 4th Generation wRfx: NONREACTIVE
Hematocrit: 40.7 % (ref 34.0–46.6)
Hemoglobin: 13.4 g/dL (ref 11.1–15.9)
Hepatitis B Surface Ag: NEGATIVE
Immature Grans (Abs): 0 10*3/uL (ref 0.0–0.1)
Immature Granulocytes: 0 %
Lymphocytes Absolute: 2.9 10*3/uL (ref 0.7–3.1)
Lymphs: 20 %
MCH: 29.1 pg (ref 26.6–33.0)
MCHC: 32.9 g/dL (ref 31.5–35.7)
MCV: 88 fL (ref 79–97)
Monocytes Absolute: 0.9 10*3/uL (ref 0.1–0.9)
Monocytes: 6 %
Neutrophils Absolute: 10.2 10*3/uL — ABNORMAL HIGH (ref 1.4–7.0)
Neutrophils: 71 %
Platelets: 270 10*3/uL (ref 150–450)
RBC: 4.61 x10E6/uL (ref 3.77–5.28)
RDW: 13.2 % (ref 11.7–15.4)
RPR Ser Ql: NONREACTIVE
Rh Factor: POSITIVE
Rubella Antibodies, IGG: <0.9 index — ABNORMAL LOW (ref >0.99)
WBC: 14.3 10*3/uL — ABNORMAL HIGH (ref 3.4–10.8)

## 2019-04-05 LAB — COMPREHENSIVE METABOLIC PANEL
ALT: 20 IU/L (ref 0–32)
AST: 15 IU/L (ref 0–40)
Albumin/Globulin Ratio: 1.6 (ref 1.2–2.2)
Albumin: 4.1 g/dL (ref 3.9–5.0)
Alkaline Phosphatase: 80 IU/L (ref 39–117)
BUN/Creatinine Ratio: 9 (ref 9–23)
BUN: 5 mg/dL — ABNORMAL LOW (ref 6–20)
Bilirubin Total: 0.3 mg/dL (ref 0.0–1.2)
CO2: 18 mmol/L — ABNORMAL LOW (ref 20–29)
Calcium: 9.4 mg/dL (ref 8.7–10.2)
Chloride: 98 mmol/L (ref 96–106)
Creatinine, Ser: 0.55 mg/dL — ABNORMAL LOW (ref 0.57–1.00)
GFR calc Af Amer: 155 mL/min/{1.73_m2} (ref >59)
GFR calc non Af Amer: 135 mL/min/{1.73_m2} (ref >59)
Globulin, Total: 2.5 g/dL (ref 1.5–4.5)
Glucose: 83 mg/dL (ref 65–99)
Potassium: 3.7 mmol/L (ref 3.5–5.2)
Sodium: 138 mmol/L (ref 134–144)
Total Protein: 6.6 g/dL (ref 6.0–8.5)

## 2019-04-05 LAB — PROTEIN / CREATININE RATIO, URINE
Creatinine, Urine: 200 mg/dL
Protein, Ur: 12 mg/dL
Protein/Creat Ratio: 60 mg/g creat (ref 0–200)

## 2019-04-05 LAB — CYTOLOGY - PAP
Chlamydia: NEGATIVE
Comment: NEGATIVE
Comment: NORMAL
Diagnosis: NEGATIVE
Neisseria Gonorrhea: NEGATIVE

## 2019-04-05 LAB — HEMOGLOBIN A1C
Est. average glucose Bld gHb Est-mCnc: 100 mg/dL
Hgb A1c MFr Bld: 5.1 % (ref 4.8–5.6)

## 2019-04-06 ENCOUNTER — Encounter: Payer: Medicaid Other | Admitting: Advanced Practice Midwife

## 2019-04-06 ENCOUNTER — Other Ambulatory Visit: Payer: Self-pay

## 2019-04-06 LAB — URINE CULTURE, OB REFLEX: Organism ID, Bacteria: NO GROWTH

## 2019-04-06 LAB — CULTURE, OB URINE

## 2019-04-06 MED ORDER — FLOVENT HFA 44 MCG/ACT IN AERO: INHALATION_SPRAY | RESPIRATORY_TRACT | 5 refills | Status: DC

## 2019-04-06 NOTE — Telephone Encounter (Signed)
PA for Asmanex 100 HFA was denied. Is there another alternative you would like patient to try?

## 2019-04-06 NOTE — Telephone Encounter (Signed)
Patient informed.  Will send in erx for Flovent as requested.

## 2019-04-06 NOTE — Telephone Encounter (Signed)
Please document that insurance denied use of  mometasone.  Have her start Flovent 44 2 inhalations twice a day.

## 2019-04-11 ENCOUNTER — Other Ambulatory Visit: Payer: Self-pay

## 2019-04-11 ENCOUNTER — Other Ambulatory Visit: Payer: Medicaid Other

## 2019-04-11 DIAGNOSIS — Z3490 Encounter for supervision of normal pregnancy, unspecified, unspecified trimester: Secondary | ICD-10-CM

## 2019-04-12 ENCOUNTER — Encounter (HOSPITAL_COMMUNITY): Payer: Self-pay

## 2019-04-12 LAB — GLUCOSE TOLERANCE, 2 HOURS W/ 1HR
Glucose, 1 hour: 112 mg/dL (ref 65–179)
Glucose, 2 hour: 56 mg/dL — ABNORMAL LOW (ref 65–152)
Glucose, Fasting: 79 mg/dL (ref 65–91)

## 2019-04-18 ENCOUNTER — Encounter: Payer: Self-pay | Admitting: General Practice

## 2019-04-21 NOTE — BH Specialist Note (Addendum)
Integrated Behavioral Health via Telemedicine Phone Visit  04/21/2019 Tracy [name] [id number]  Number of Integrated Behavioral Health visits: 2 Session Start time: 1:19  Session End time: 1:28 Total time: 9  Referring Provider: Thressa [name], CNM Type of Visit: Phone Patient/Family location: Home [facility] Provider location: [facility] All persons participating in visit: Patient Tracy [name] and Tracy [name]    Confirmed patient's address: Yes  Confirmed patient's phone number: Yes  Any changes to demographics: No   Confirmed patient's insurance: Yes  Any changes to patient's insurance: No   Discussed confidentiality: At previous visit  I connected with Tracy [name]  by a phone enabled telemedicine application and verified that I am speaking with the correct person using two identifiers.     I discussed the limitations of evaluation and management by telemedicine and the availability of in person appointments.  I discussed that the purpose of this visit is to provide behavioral health care while limiting exposure to the novel coronavirus.   Discussed there is a possibility of technology failure and discussed alternative modes of communication if that failure occurs.  I discussed that engaging in this video visit, they consent to the provision of behavioral healthcare and the services will be billed under their insurance.  Patient and/or legal guardian expressed understanding and consented to phone visit: Yes   PRESENTING CONCERNS: Patient and/or family reports the following symptoms/concerns: Pt states her primary symptoms today are fatigue and lack of quality sleep, nausea has gone away, she is still attending [facility] for therapy and BH medication management, and is now taking Zoloft, Buspar and Vistaril.  Duration of problem: Increase in pregnancy; Severity of problem: moderate  STRENGTHS (Protective Factors/Coping Skills): Attending therapy, taking BH medication as  prescribed, implementing self-coping strategies  GOALS ADDRESSED: Patient will: 1.  Reduce symptoms of: anxiety and depression  2.  Demonstrate ability to: Increase healthy adjustment to current life circumstances  INTERVENTIONS: Interventions utilized:  Supportive Counseling Standardized Assessments completed: GAD-7 and PHQ 9  ASSESSMENT: Patient currently experiencing Adjustment disorder with mixed anxious and depressed mood.   Patient may benefit from continued psychoeducation and brief therapeutic interventions regarding coping with symptoms of anxiety and depression .  PLAN: 1. Follow up with behavioral health clinician on : As needed 2. Behavioral recommendations:  -Continue attending therapy sessions at [facility] -Continue taking BH medications and  prenatal vitamins as prescribed -Continue using self-coping strategies daily, for as long as remain helpful 3. Referral(s): Integrated [facility] (In Clinic)  I discussed the assessment and treatment plan with the patient and/or parent/guardian. They were provided an opportunity to ask questions and all were answered. They agreed with the plan and demonstrated an understanding of the instructions.   They were advised to call back or seek an in-person evaluation if the symptoms worsen or if the condition fails to improve as anticipated.  Tracy [name]  Depression screen [facility] 2/9 05/03/2019 04/04/2019 03/23/2019  Decreased Interest 2 1 1   Down, Depressed, Hopeless 0 1 1  PHQ - 2 Score 2 2 2   Altered sleeping 3 3 3   Tired, decreased energy 3 3 3   Change in appetite 0 2 1  Feeling bad or failure about yourself  0 0 0  Trouble concentrating 0 0 1  Moving slowly or fidgety/restless 0 0 0  Suicidal thoughts 0 0 0  PHQ-9 Score 8 10 10    GAD 7 : Generalized Anxiety Score 05/03/2019 04/04/2019 03/23/2019  Nervous, Anxious, on Edge 2  3 3  Control/stop worrying 2 3 1   Worry too much - different things 2 3 1    Trouble relaxing 1 3 1   Restless 1 3 1   Easily annoyed or irritable 1 3 3   Afraid - awful might happen 1 3 1   Total GAD 7 Score 10 21 11

## 2019-05-03 ENCOUNTER — Other Ambulatory Visit: Payer: Self-pay

## 2019-05-03 ENCOUNTER — Ambulatory Visit (INDEPENDENT_AMBULATORY_CARE_PROVIDER_SITE_OTHER): Payer: Medicaid Other | Admitting: Clinical

## 2019-05-03 DIAGNOSIS — F4323 Adjustment disorder with mixed anxiety and depressed mood: Secondary | ICD-10-CM | POA: Diagnosis present

## 2019-05-06 NOTE — L&D Delivery Note (Addendum)
OB/GYN Faculty Practice Delivery Note  Tracy [name] is a 24 y.o. G1P1 s/p VAVD at [redacted]w[redacted]d. She was admitted for SROM and was found to have Pre-eclampsia with severe features vs atypical HELLP on admission.   ROM: 17h 46m with clear fluid GBS Status: Negative/-- (05/18 1528) Maximum Maternal Temperature: 98.0 F    Labor Progress:  Patient arrived at 1.5 cm dilation and was induced with a foley bulb and miso x1, subsequently started on pitocin. After seven hours on pitocin started to have decelerations. At bedside patient noted to be complete but with significant fetal bradycardia to the 50's, at which point a VAVD was performed.   Delivery Date/Time: 10/09/2019 at 2345  Operative Delivery Note Infant was delivered via Vacuum Assisted Vaginal Delivery due to fetal bradycardia.  The patient was examined and found to be Presentation: vertex; Position: Left,, Occiput,, Anterior; Station: +4.  Verbal consent: obtained from patient.  Risks and benefits discussed in detail.  Risks include, but are not limited to the risks of anesthesia, bleeding, infection, damage to maternal tissues, fetal cephalhematoma.  There is also the risk of inability to effect vaginal delivery of the head, or shoulder dystocia that cannot be resolved by established maneuvers, leading to the need for emergency cesarean section.  The Kiwi vacuum was positioned over the sagittal suture 3 cm anterior to posterior fontanelle.  Pressure was then increased to 500 mmHg, and the patient was instructed to push.  Pulling was administered along the pelvic curve.  One pull was administered during one contraction with some descent. A tight band was felt and given the urgency of the situation a R mediolateral episiotomy was cut. Three more pulls were administered without release of pressure between contractions.  There was one popoff.  The infant was then delivered atraumatically.  Head delivered in LOA position. No nuchal cord present.  Shoulder and body delivered in usual fashion with ease. Infant with poor tone and no spontaneous cry, cord was clamped immediately and taken to the warmer for resuscitation. Arterial and venous cord blood drawn. Placenta delivered spontaneously with gentle cord traction. Fundus firm with massage and Pitocin.   A post-placental Liletta IUD was then placed via bimanual method, see separate procedure note for details.  Labia, perineum, vagina, and cervix then inspected with 2nd degree perineal laceration, which was repaired with 2-0 Vicryl rapide in usual fashion.   After an extended period of resuscitation at the warmer infant's condition much improved and he was brought back to patient for skin to skin.  Sponge, instrument and needle counts were correct x2.  Dr. Shawnie Pons present and supervising throughout delivery.  Placenta: 3v intact, notably diffuse calcifications, to L&D Complications: none Lacerations: 2nd degree perineal, repaired in usual fashion with 2-0 Vicryl rapide EBL: 248 cc Analgesia: epidural, local   Infant: APGAR (1 MIN):  3 APGAR (5 MINS): 8   Weight: 2435 grams  Arterial cord gas: pH 7.26, pCO2 54, HCO3 23.6   Zack Seal, MD/[facility] OB/GYN Fellow, Faculty Practice

## 2019-05-10 ENCOUNTER — Telehealth (INDEPENDENT_AMBULATORY_CARE_PROVIDER_SITE_OTHER): Payer: Medicaid Other | Admitting: Lactation Services

## 2019-05-10 DIAGNOSIS — Z3402 Encounter for supervision of normal first pregnancy, second trimester: Secondary | ICD-10-CM

## 2019-05-10 NOTE — Telephone Encounter (Signed)
Pt called and LM on nurse voicemail that she is having some clear watery fluid, sometimes it is white in color, leaking from her Vagina and is usually in the mornings. She reports once it felt like a gush and a small amount was in her underwear, she reports she was laying on the couch. She reports is it not constant and not trickling. She reports some days it is more than others and some days she does not notice it.   Pt reports she is having intermittent cramping daily up to every hour or more, feels more like pressure most of the time with some intermittent sharp pain. Pain goes away on its own. Pt denies bleeding. Pt reports the discharge usually does not have an odor. She denies itching.   Pt has not been able to feel the baby moving yet. She reports her stomach is growing some. Pt has a video appointment on 05/26/19.   Advised pt that some cramping on early pregnancy is normal as the uterus grows. Discussed that Vaginal Discharge also increases with pregnancy.   Reviewed that if she has gushing of fluid, bleeding, or increased pain she is to go to the MAU for evaluation. Advised pt that if vaginal discharge continues or becomes foul smelling she needs to come in for a vaginal swab for evaluation. Pt voiced understanding.

## 2019-05-20 ENCOUNTER — Encounter: Payer: Self-pay | Admitting: Allergy and Immunology

## 2019-05-20 ENCOUNTER — Other Ambulatory Visit: Payer: Self-pay | Admitting: *Deleted

## 2019-05-20 MED ORDER — ALBUTEROL SULFATE HFA 108 (90 BASE) MCG/ACT IN AERS: INHALATION_SPRAY | RESPIRATORY_TRACT | 1 refills | Status: DC

## 2019-05-26 ENCOUNTER — Other Ambulatory Visit: Payer: Self-pay

## 2019-05-26 ENCOUNTER — Encounter (INDEPENDENT_AMBULATORY_CARE_PROVIDER_SITE_OTHER): Payer: Medicaid Other | Admitting: Obstetrics and Gynecology

## 2019-05-26 ENCOUNTER — Ambulatory Visit (HOSPITAL_COMMUNITY): Payer: Medicaid Other

## 2019-05-26 ENCOUNTER — Other Ambulatory Visit (HOSPITAL_COMMUNITY): Payer: Medicaid Other

## 2019-05-26 NOTE — Progress Notes (Signed)
@  1035am no answer  @1040am  left voicemail message that I will call 1 more time for her appointment and that if we happen to miss her that she will unfortunately have to reschedule. @1047am  no answer lvm that I will have to reschedule her appointment and to look for a call from the front office to reschedule

## 2019-05-27 NOTE — Progress Notes (Signed)
Patient did not keep her appointment today.    Duane Lope, NP 05/27/2019 8:31 AM

## 2019-05-30 ENCOUNTER — Telehealth (INDEPENDENT_AMBULATORY_CARE_PROVIDER_SITE_OTHER): Payer: Medicaid Other | Admitting: Family Medicine

## 2019-05-30 ENCOUNTER — Other Ambulatory Visit: Payer: Self-pay

## 2019-05-30 VITALS — BP 136/85 | HR 101

## 2019-05-30 DIAGNOSIS — J45909 Unspecified asthma, uncomplicated: Secondary | ICD-10-CM

## 2019-05-30 DIAGNOSIS — O99212 Obesity complicating pregnancy, second trimester: Secondary | ICD-10-CM | POA: Diagnosis not present

## 2019-05-30 DIAGNOSIS — Z283 Underimmunization status: Secondary | ICD-10-CM

## 2019-05-30 DIAGNOSIS — O99210 Obesity complicating pregnancy, unspecified trimester: Secondary | ICD-10-CM

## 2019-05-30 DIAGNOSIS — Z3A19 19 weeks gestation of pregnancy: Secondary | ICD-10-CM

## 2019-05-30 DIAGNOSIS — Z3490 Encounter for supervision of normal pregnancy, unspecified, unspecified trimester: Secondary | ICD-10-CM

## 2019-05-30 DIAGNOSIS — F419 Anxiety disorder, unspecified: Secondary | ICD-10-CM

## 2019-05-30 DIAGNOSIS — Z3402 Encounter for supervision of normal first pregnancy, second trimester: Secondary | ICD-10-CM | POA: Insufficient documentation

## 2019-05-30 DIAGNOSIS — O09899 Supervision of other high risk pregnancies, unspecified trimester: Secondary | ICD-10-CM

## 2019-05-30 DIAGNOSIS — F3289 Other specified depressive episodes: Secondary | ICD-10-CM

## 2019-05-30 NOTE — Progress Notes (Signed)
I connected with  Tracy [name] on 05/30/19 at  2:15 PM EST by telephone and verified that I am speaking with the correct person using two identifiers.   I discussed the limitations, risks, security and privacy concerns of performing an evaluation and management service by telephone and the availability of in person appointments. I also discussed with the patient that there may be a patient responsible charge related to this service. The patient expressed understanding and agreed to proceed.  Ernestina Patches, CMA 05/30/2019  2:07 PM   Middle to lower back pain states it goes all the way across.

## 2019-05-30 NOTE — Progress Notes (Signed)
   TELEHEALTH VIRTUAL OBSTETRICS VISIT ENCOUNTER NOTE  I connected with Tracy [name] on 05/30/19 at  2:15 PM EST by telephone at home and verified that I am speaking with the correct person using two identifiers.   I discussed the limitations, risks, security and privacy concerns of performing an evaluation and management service by telephone and the availability of in person appointments. I also discussed with the patient that there may be a patient responsible charge related to this service. The patient expressed understanding and agreed to proceed.  Subjective:  Tracy [name] is a 24 y.o. G1P0 at 107w6dbeing followed for ongoing prenatal care.  She is currently monitored for the following issues for this low-risk pregnancy and has Right sided abdominal pain; Substernal pain; Nausea alone; Allergic rhinoconjunctivitis; Moderate persistent asthma; Supervision of low-risk pregnancy; Obesity in pregnancy, antepartum; Depression; Anxiety; Asthma; Tetrahydrocannabinol (THC) use disorder, mild, abuse; and Rubella non-immune status, antepartum on their problem list.  Patient reports nausea, which is improving, and continues to have decreased appetite. Reports fetal movement. Denies any contractions, bleeding or leaking of fluid.   The following portions of the patient's history were reviewed and updated as appropriate: allergies, current medications, past family history, past medical history, past social history, past surgical history and problem list.   Objective:   General:  Alert, oriented and cooperative.   Mental Status: Normal mood and affect perceived. Normal judgment and thought content.  Rest of physical exam deferred due to type of encounter  Assessment and Plan:  Pregnancy: G1P0 at 159w6d. Encounter for supervision of low-risk pregnancy, antepartum - Continue routine prenatal care - Anatomy scan next week  2. Obesity in pregnancy, antepartum  3. Other [facility]. Uncomplicated asthma, unspecified asthma severity, unspecified whether persistent - no new symptoms  6. Rubella non-immune status, antepartum - MMR postpartum  Preterm labor symptoms and general obstetric precautions including but not limited to vaginal bleeding, contractions, leaking of fluid and fetal movement were reviewed in detail with the patient.  I discussed the assessment and treatment plan with the patient. The patient was provided an opportunity to ask questions and all were answered. The patient agreed with the plan and demonstrated an understanding of the instructions. The patient was advised to call back or seek an in-person office evaluation/[facility] any urgent or concerning symptoms. Please refer to After Visit Summary for other counseling recommendations.   I provided 12 minutes of non-face-to-face time during this encounter.  Return in about 4 weeks (around 06/27/2019), or LOB follow up, virtual ok.  Future Appointments  Date Time Provider De[place]2/07/2019  2:00 PM [place]  06/08/2019  2:00 PM [place] WH-MFCUS MFC-US  06/22/2019  1:30 PM Kozlow, ErDonnamarie PoagMD [facility] None  06/28/2019  1:15 PM KoArdean LarsenKaMervyn SkeetersCNArcolaODaven[place]or Wo[place]

## 2019-05-30 NOTE — Patient Instructions (Signed)
Eating Plan for Pregnant Women While you are pregnant, your body requires additional nutrition to help support your growing baby. You also have a higher need for some vitamins and minerals, such as folic acid, calcium, iron, and vitamin D. Eating a healthy, well-balanced diet is very important for your health and your baby's health. Your need for extra calories varies for the three 3-month segments of your pregnancy (trimesters). For most women, it is recommended to consume:  150 extra calories a day during the first trimester.  300 extra calories a day during the second trimester.  300 extra calories a day during the third trimester. What are tips for following this plan?   Do not try to lose weight or go on a diet during pregnancy.  Limit your overall intake of foods that have "empty calories." These are foods that have little nutritional value, such as sweets, desserts, candies, and sugar-sweetened beverages.  Eat a variety of foods (especially fruits and vegetables) to get a full range of vitamins and minerals.  Take a prenatal vitamin to help meet your additional vitamin and mineral needs during pregnancy, specifically for folic acid, iron, calcium, and vitamin D.  Remember to stay active. Ask your health care provider what types of exercise and activities are safe for you.  Practice good food safety and cleanliness. Wash your hands before you eat and after you prepare raw meat. Wash all fruits and vegetables well before peeling or eating. Taking these actions can help to prevent food-borne illnesses that can be very dangerous to your baby, such as listeriosis. Ask your health care provider for more information about listeriosis. What does 150 extra calories look like? Healthy options that provide 150 extra calories each day could be any of the following:  6-8 oz (170-230 g) of plain low-fat yogurt with  cup of berries.  1 apple with 2 teaspoons (11 g) of peanut butter.  Cut-up  vegetables with  cup (60 g) of hummus.  8 oz (230 mL) or 1 cup of low-fat chocolate milk.  1 stick of string cheese with 1 medium orange.  1 peanut butter and jelly sandwich that is made with one slice of whole-wheat bread and 1 tsp (5 g) of peanut butter. For 300 extra calories, you could eat two of those healthy options each day. What is a healthy amount of weight to gain? The right amount of weight gain for you is based on your BMI before you became pregnant. If your BMI:  Was less than 18 (underweight), you should gain 28-40 lb (13-18 kg).  Was 18-24.9 (normal), you should gain 25-35 lb (11-16 kg).  Was 25-29.9 (overweight), you should gain 15-25 lb (7-11 kg).  Was 30 or greater (obese), you should gain 11-20 lb (5-9 kg). What if I am having twins or multiples? Generally, if you are carrying twins or multiples:  You may need to eat 300-600 extra calories a day.  The recommended range for total weight gain is 25-54 lb (11-25 kg), depending on your BMI before pregnancy.  Talk with your health care provider to find out about nutritional needs, weight gain, and exercise that is right for you. What foods can I eat?  Fruits All fruits. Eat a variety of colors and types of fruit. Remember to wash your fruits well before peeling or eating. Vegetables All vegetables. Eat a variety of colors and types of vegetables. Remember to wash your vegetables well before peeling or eating. Grains All grains. Choose whole grains, such   as whole-wheat bread, oatmeal, or brown rice. Meats and other protein foods Lean meats, including chicken, turkey, fish, and lean cuts of beef, veal, or pork. If you eat fish or seafood, choose options that are higher in omega-3 fatty acids and lower in mercury, such as salmon, herring, mussels, trout, sardines, pollock, shrimp, crab, and lobster. Tofu. Tempeh. Beans. Eggs. Peanut butter and other nut butters. Make sure that all meats, poultry, and eggs are cooked to  food-safe temperatures or "well-done." Two or more servings of fish are recommended each week in order to get the most benefits from omega-3 fatty acids that are found in seafood. Choose fish that are lower in mercury. You can find more information online:  www.fda.gov Dairy Pasteurized milk and milk alternatives (such as almond milk). Pasteurized yogurt and pasteurized cheese. Cottage cheese. Sour cream. Beverages Water. Juices that contain 100% fruit juice or vegetable juice. Caffeine-free teas and decaffeinated coffee. Drinks that contain caffeine are okay to drink, but it is better to avoid caffeine. Keep your total caffeine intake to less than 200 mg each day (which is 12 oz or 355 mL of coffee, tea, or soda) or the limit as told by your health care provider. Fats and oils Fats and oils are okay to include in moderation. Sweets and desserts Sweets and desserts are okay to include in moderation. Seasoning and other foods All pasteurized condiments. The items listed above may not be a complete list of foods and beverages you can eat. Contact a dietitian for more information. What foods are not recommended? Fruits Unpasteurized fruit juices. Vegetables Raw (unpasteurized) vegetable juices. Meats and other protein foods Lunch meats, bologna, hot dogs, or other deli meats. (If you must eat those meats, reheat them until they are steaming hot.) Refrigerated pat, meat spreads from a meat counter, smoked seafood that is found in the refrigerated section of a store. Raw or undercooked meats, poultry, and eggs. Raw fish, such as sushi or sashimi. Fish that have high mercury content, such as tilefish, shark, swordfish, and king mackerel. To learn more about mercury in fish, talk with your health care provider or look for online resources, such as:  www.fda.gov Dairy Raw (unpasteurized) milk and any foods that have raw milk in them. Soft cheeses, such as feta, queso blanco, queso fresco, Brie,  Camembert cheeses, blue-veined cheeses, and Panela cheese (unless it is made with pasteurized milk, which must be stated on the label). Beverages Alcohol. Sugar-sweetened beverages, such as sodas, teas, or energy drinks. Seasoning and other foods Homemade fermented foods and drinks, such as pickles, sauerkraut, or kombucha drinks. (Store-bought pasteurized versions of these are okay.) Salads that are made in a store or deli, such as ham salad, chicken salad, egg salad, tuna salad, and seafood salad. The items listed above may not be a complete list of foods and beverages you should avoid. Contact a dietitian for more information. Where to find more information To calculate the number of calories you need based on your height, weight, and activity level, you can use an online calculator such as:  www.choosemyplate.gov/MyPlatePlan To calculate how much weight you should gain during pregnancy, you can use an online pregnancy weight gain calculator such as:  www.choosemyplate.gov/pregnancy-weight-gain-calculator Summary  While you are pregnant, your body requires additional nutrition to help support your growing baby.  Eat a variety of foods, especially fruits and vegetables to get a full range of vitamins and minerals.  Practice good food safety and cleanliness. Wash your hands before you eat   and after you prepare raw meat. Wash all fruits and vegetables well before peeling or eating. Taking these actions can help to prevent food-borne illnesses, such as listeriosis, that can be very dangerous to your baby.  Do not eat raw meat or fish. Do not eat fish that have high mercury content, such as tilefish, shark, swordfish, and king mackerel. Do not eat unpasteurized (raw) dairy.  Take a prenatal vitamin to help meet your additional vitamin and mineral needs during pregnancy, specifically for folic acid, iron, calcium, and vitamin D. This information is not intended to replace advice given to you by  your health care provider. Make sure you discuss any questions you have with your health care provider. Document Revised: 09/09/2018 Document Reviewed: 01/16/2017 Elsevier Patient Education  2020 Elsevier Inc.  

## 2019-06-08 ENCOUNTER — Ambulatory Visit (HOSPITAL_COMMUNITY)
Admission: RE | Admit: 2019-06-08 | Discharge: 2019-06-08 | Disposition: A | Discharge status: Home / Self Care | Payer: Medicaid Other | Source: Ambulatory Visit | Attending: Advanced Practice Midwife | Admitting: Advanced Practice Midwife

## 2019-06-08 ENCOUNTER — Encounter (HOSPITAL_COMMUNITY): Payer: Self-pay

## 2019-06-08 ENCOUNTER — Ambulatory Visit (HOSPITAL_COMMUNITY): Payer: Medicaid Other | Admitting: *Deleted

## 2019-06-08 ENCOUNTER — Other Ambulatory Visit: Payer: Self-pay

## 2019-06-08 DIAGNOSIS — F419 Anxiety disorder, unspecified: Secondary | ICD-10-CM

## 2019-06-08 DIAGNOSIS — J45909 Unspecified asthma, uncomplicated: Secondary | ICD-10-CM

## 2019-06-08 DIAGNOSIS — O99210 Obesity complicating pregnancy, unspecified trimester: Secondary | ICD-10-CM

## 2019-06-08 DIAGNOSIS — F3289 Other specified depressive episodes: Secondary | ICD-10-CM | POA: Insufficient documentation

## 2019-06-08 DIAGNOSIS — O99342 Other mental disorders complicating pregnancy, second trimester: Secondary | ICD-10-CM

## 2019-06-08 DIAGNOSIS — Z3490 Encounter for supervision of normal pregnancy, unspecified, unspecified trimester: Secondary | ICD-10-CM

## 2019-06-08 DIAGNOSIS — O99212 Obesity complicating pregnancy, second trimester: Secondary | ICD-10-CM

## 2019-06-08 DIAGNOSIS — Z3A21 21 weeks gestation of pregnancy: Secondary | ICD-10-CM | POA: Diagnosis not present

## 2019-06-22 ENCOUNTER — Ambulatory Visit (INDEPENDENT_AMBULATORY_CARE_PROVIDER_SITE_OTHER): Payer: Medicaid Other | Admitting: Allergy and Immunology

## 2019-06-22 ENCOUNTER — Other Ambulatory Visit: Payer: Self-pay

## 2019-06-22 ENCOUNTER — Encounter: Payer: Self-pay | Admitting: Allergy and Immunology

## 2019-06-22 VITALS — BP 106/56 | HR 112 | Temp 98.2°F | Resp 16

## 2019-06-22 DIAGNOSIS — J4540 Moderate persistent asthma, uncomplicated: Secondary | ICD-10-CM | POA: Diagnosis not present

## 2019-06-22 DIAGNOSIS — J3089 Other allergic rhinitis: Secondary | ICD-10-CM | POA: Diagnosis not present

## 2019-06-22 DIAGNOSIS — Z3A23 23 weeks gestation of pregnancy: Secondary | ICD-10-CM

## 2019-06-22 MED ORDER — FLOVENT HFA 110 MCG/ACT IN AERO: INHALATION_SPRAY | RESPIRATORY_TRACT | 5 refills | Status: DC

## 2019-06-22 MED ORDER — ALBUTEROL SULFATE HFA 108 (90 BASE) MCG/ACT IN AERS: INHALATION_SPRAY | RESPIRATORY_TRACT | 1 refills | Status: DC

## 2019-06-22 NOTE — Progress Notes (Signed)
[place] [facility] [place] [facility] [place]   Follow-up Note  Referring Provider: Cherene Altes, MD Primary Provider: Patient, No Pcp Per Date of Office Visit: 06/22/2019  Subjective:   Tracy [name] (DOB: 1995/05/16) is a 24 y.o. female who returns to the [facility] [place] on 06/22/2019 in re-evaluation of the following:  HPI: Tracy [name] returns to this clinic in evaluation of asthma and allergic rhinitis.  I last saw her in this clinic on 30 March 2019 at which point in time we started her on Asmanex and had her continue to use her montelukast.  She still continues to have a requirement for short acting bronchodilator averaging out to twice a day usually for issues with shortness of breath and wheezing.  This occurs even though she continues to use Flovent 44 - 2 inhalations twice a day.  She has been intolerant of using budesonide in the past and her insurance will not pay for inhaled mometasone.  Her nose is doing relatively well at this point in time.  She does use an antihistamine on occasion.  She continues to use montelukast consistently.  She has not required a systemic steroid or an antibiotic for any type of airway issue since her last visit.  She did receive the flu vaccine.  Currently she is [redacted] weeks pregnant.  Allergies as of 06/22/2019   No Known Allergies     Medication List    albuterol 108 (90 Base) MCG/ACT inhaler Commonly known as: ProAir HFA Can inhale two puffs every four to six hours as needed for cough or wheeze.   albuterol (2.5 MG/3ML) 0.083% nebulizer solution Commonly known as: PROVENTIL Take 2.5 mg by nebulization every 6 (six) hours as needed for wheezing or shortness of breath.   aspirin EC 81 MG tablet Take 1 tablet (81 mg total) by mouth daily. Take after 12 weeks for prevention of preeclampsia later in pregnancy. Start taking daily on 04/05/2019   Blood Pressure Kit Devi 1 Device by Does not apply route as needed.    diphenhydrAMINE 25 MG tablet Commonly known as: BENADRYL Take 25 mg by mouth at bedtime.   Flovent HFA 44 MCG/ACT inhaler Generic drug: fluticasone Inhale two puffs twice daily to prevent cough or wheeze.  Rinse, gargle, and spit after use.   hydrOXYzine 10 MG tablet Commonly known as: ATARAX/VISTARIL Take 10 mg by mouth 3 (three) times daily as needed.   ketoconazole 2 % shampoo Commonly known as: NIZORAL U SHAMPOO QOD UTD   loratadine 10 MG tablet Commonly known as: CLARITIN Can take one tablet by mouth once daily if needed.   montelukast 10 MG tablet Commonly known as: SINGULAIR Take 1 tablet by mouth once daily   PRENATAL VITAMIN PO Take 1 tablet by mouth daily.   promethazine 25 MG tablet Commonly known as: PHENERGAN Take 25 mg by mouth every 6 (six) hours as needed for nausea or vomiting. Taking 1/2 tab usually   sertraline 50 MG tablet Commonly known as: ZOLOFT Take 75 mg by mouth daily.       Past Medical History:  Diagnosis Date  . Abdominal pain   . Anxiety   . Asthma   . Complication of anesthesia    panic attack   . Depression     Past Surgical History:  Procedure Laterality Date  . ADENOIDECTOMY    . CHOLECYSTECTOMY  05/2016  . ESOPHAGOGASTRODUODENOSCOPY N/A 08/12/2013   Procedure: ESOPHAGOGASTRODUODENOSCOPY (EGD);  Surgeon: Oletha Blend,  MD;  Location: [facility];  Service: Endoscopy;  Laterality: N/A;  . TYMPANOSTOMY TUBE PLACEMENT      Review of systems negative except as noted in HPI / PMHx or noted below:  Review of Systems  Constitutional: Negative.   HENT: Negative.   Eyes: Negative.   Respiratory: Negative.   Cardiovascular: Negative.   Gastrointestinal: Negative.   Genitourinary: Negative.   Musculoskeletal: Negative.   Skin: Negative.   Neurological: Negative.   Endo/Heme/Allergies: Negative.   Psychiatric/Behavioral: Negative.      Objective:   Vitals:   06/22/19 1348  BP: (!) 106/56  Pulse: (!) 112  Resp:  16  Temp: 98.2 F (36.8 C)  SpO2: 98%          Physical Exam Constitutional:      Appearance: She is not diaphoretic.  HENT:     Head: Normocephalic.     Right Ear: Tympanic membrane, ear canal and external ear normal.     Left Ear: Tympanic membrane, ear canal and external ear normal.     Nose: Nose normal. No mucosal edema or rhinorrhea.     Mouth/Throat:     Pharynx: Uvula midline. No oropharyngeal exudate.  Eyes:     Conjunctiva/sclera: Conjunctivae normal.  Neck:     Thyroid: No thyromegaly.     Trachea: Trachea normal. No tracheal tenderness or tracheal deviation.  Cardiovascular:     Rate and Rhythm: Normal rate and regular rhythm.     Heart sounds: Normal heart sounds, S1 normal and S2 normal. No murmur.  Pulmonary:     Effort: No respiratory distress.     Breath sounds: Normal breath sounds. No stridor. No wheezing or rales.  Lymphadenopathy:     Head:     Right side of head: No tonsillar adenopathy.     Left side of head: No tonsillar adenopathy.     Cervical: No cervical adenopathy.  Skin:    Findings: No erythema or rash.     Nails: There is no clubbing.  Neurological:     Mental Status: She is alert.     Diagnostics:    Spirometry was performed and demonstrated an FEV1 of 2.45 at 79 % of predicted.   Assessment and Plan:   1. Not well controlled moderate persistent asthma   2. Perennial allergic rhinitis   3. [redacted] weeks gestation of pregnancy     1.  Increase Flovent 110 - 2 inhalations 2 times per day  2.  Continue montelukast 10 mg - 1 tablet 1 time per day  3.  If needed:   A. ProAir HFA 2 inhalations or nebulization every 4-6 hours  B.  OTC antihistamine - loratadine 10 mg -1 tablet 1 time per day  4.  Return to clinic in 12 weeks or earlier if problem  Tracy [name] will increase her dose of inhaled steroid as noted above and we will assume she will continue to do well on this plan as she moves forward.  I will see her back in this clinic in 12  weeks or earlier if there is a problem.  Eric Kozlow, MD Allergy / Immunology [facility] 

## 2019-06-22 NOTE — Patient Instructions (Addendum)
  1.  Increase Flovent 110 - 2 inhalations 2 times per day  2.  Continue montelukast 10 mg - 1 tablet 1 time per day  3.  If needed:   A. ProAir HFA 2 inhalations or nebulization every 4-6 hours  B.  OTC antihistamine - loratadine 10 mg -1 tablet 1 time per day  4.  Return to clinic in 12 weeks or earlier if problem

## 2019-06-23 ENCOUNTER — Encounter: Payer: Self-pay | Admitting: Allergy and Immunology

## 2019-06-28 ENCOUNTER — Telehealth (INDEPENDENT_AMBULATORY_CARE_PROVIDER_SITE_OTHER): Payer: Medicaid Other | Admitting: Student

## 2019-06-28 VITALS — BP 136/87 | HR 99

## 2019-06-28 DIAGNOSIS — O99212 Obesity complicating pregnancy, second trimester: Secondary | ICD-10-CM

## 2019-06-28 DIAGNOSIS — J45909 Unspecified asthma, uncomplicated: Secondary | ICD-10-CM

## 2019-06-28 DIAGNOSIS — O99210 Obesity complicating pregnancy, unspecified trimester: Secondary | ICD-10-CM

## 2019-06-28 DIAGNOSIS — O99342 Other mental disorders complicating pregnancy, second trimester: Secondary | ICD-10-CM | POA: Diagnosis not present

## 2019-06-28 DIAGNOSIS — F419 Anxiety disorder, unspecified: Secondary | ICD-10-CM

## 2019-06-28 DIAGNOSIS — Z3A24 24 weeks gestation of pregnancy: Secondary | ICD-10-CM | POA: Diagnosis not present

## 2019-06-28 DIAGNOSIS — F3289 Other specified depressive episodes: Secondary | ICD-10-CM | POA: Diagnosis not present

## 2019-06-28 DIAGNOSIS — Z3492 Encounter for supervision of normal pregnancy, unspecified, second trimester: Secondary | ICD-10-CM

## 2019-06-28 NOTE — Progress Notes (Addendum)
I connected with@ on 06/29/19 at  1:15 PM EST by: My Chart and verified that I am speaking with the correct person using two identifiers.  Patient is located at home and provider is located at [place].     The purpose of this virtual visit is to provide medical care while limiting exposure to the novel coronavirus. I discussed the limitations, risks, security and privacy concerns of performing an evaluation and management service by My Chart and the availability of in person appointments. I also discussed with the patient that there may be a patient responsible charge related to this service. By engaging in this virtual visit, you consent to the provision of healthcare.  Additionally, you authorize for your insurance to be billed for the services provided during this visit.  The patient expressed understanding and agreed to proceed.  The following staff members participated in the virtual visit:  Corinda Gubler    PRENATAL VISIT NOTE  Subjective:  Tracy [name] is a 24 y.o. G1P0 at [redacted]w[redacted]d  for phone visit for ongoing prenatal care.  She is currently monitored for the following issues for this low-risk pregnancy and has Right sided abdominal pain; Substernal pain; Nausea alone; Allergic rhinoconjunctivitis; Moderate persistent asthma; Supervision of low-risk pregnancy; Obesity in pregnancy, antepartum; Depression; Anxiety; Asthma; Tetrahydrocannabinol (THC) use disorder, mild, abuse; and Rubella non-immune status, antepartum on their problem list.  Patient reports no complaints.  She is thinking she does not want to have any birth control right now. She reports that she has not had much of an appetite lately. She wants to know if she can take a tour or if she needs to pre-register. She does not want the glucose tolerance test because she doesn't like it . She reports taking nightly benadryl for anxiety; vistaril did not work because it caused her more anxiety and she does not want to increase her Zoloft bc her  doctor wants her to taper before delivery. Doctor is Dr. Excell Seltzer at [facility].   Contractions: Not present. Vag. Bleeding: None.  Movement: Present. Denies leaking of fluid.   The following portions of the patient's history were reviewed and updated as appropriate: allergies, current medications, past family history, past medical history, past social history, past surgical history and problem list.   Objective:   Vitals:   06/28/19 1328 06/28/19 1331  BP: (!) 143/83 136/87  Pulse: (!) 110 99   Self-Obtained  Fetal Status:     Movement: Present     Assessment and Plan:  Pregnancy: G1P0 at [redacted]w[redacted]d 1. Encounter for supervision of low-risk pregnancy in second trimester -reviewed importance of GTT; patient agrees to take the test.  -keep [place] appt next week.  -Patient is using BabyRX and putting them in [place]; all normal. One elevated pressure today but normal on retake.  2. Obesity in pregnancy, antepartum -recommended walking 20 min every day as this will help with her depression and anxiety as well.   3. Other depression   4. Anxiety -taking benadryl at night for sleep; suggested that she try melatonin; recommended that she increase Zoloft> patient is hesitant to do so because her Psych Md (Dr. Excell Seltzer) wants to taper her off of it before delivery.Recommeneded that Dr. Excell Seltzer reach out to our office about safety of Zoloft in pregnancy, advised patient that we do not recommend tapers as her risk for anxiety, poor coping and PPD is high. Recommended that she try Magnesium 200 mg nightly for sleep. Patient agrees to try Mg supplements.  -  will have patient see Vesta Mixer; message sent to clinic to schedule.   5. Uncomplicated asthma, unspecified asthma severity, unspecified whether persistent -Not addressed at this visit.  Preterm labor symptoms and general obstetric precautions including but not limited to vaginal bleeding, contractions, leaking of fluid and fetal movement were reviewed in  detail with the patient.  No follow-ups on file.  Future Appointments  Date Time Provider [place]  07/27/2019  8:50 AM [facility]  07/27/2019 10:15 AM Luvenia Redden, PA-C [facility]  09/12/2019  1:30 PM Kozlow, Donnamarie Poag, MD [facility] None     Time spent on virtual visit: 17 minutes  [place], CNM

## 2019-06-29 MED ORDER — MAGNESIUM 200 MG PO TABS: 1.0000 | ORAL_TABLET | Freq: Every evening | ORAL | 2 refills | Status: DC

## 2019-07-04 ENCOUNTER — Ambulatory Visit (INDEPENDENT_AMBULATORY_CARE_PROVIDER_SITE_OTHER): Payer: Medicaid Other | Admitting: Clinical

## 2019-07-04 ENCOUNTER — Other Ambulatory Visit: Payer: Self-pay

## 2019-07-04 DIAGNOSIS — F4323 Adjustment disorder with mixed anxiety and depressed mood: Secondary | ICD-10-CM

## 2019-07-04 NOTE — Patient Instructions (Addendum)
What if I or someone I know is in crisis?  . If you are thinking about harming yourself or having thoughts of suicide, or if you know someone who is, seek help right away.  . Call your doctor or mental health care provider.  . Call 911 or go to a hospital emergency room to get immediate help, or ask a friend or family member to help you do these things.  . Call the [place] National Suicide Prevention Lifeline's toll-free, 24-hour hotline at 1-800-273-TALK (425)608-0229) or TTY: 1-800-799-4 TTY 980-203-9017) to talk to a trained counselor.  . If you are in crisis, make sure you are not left alone.   . If someone else is in crisis, make sure he or she is not left alone   24 Hour :   [place] National Suicide Hotline: 256-339-7409  Therapeutic Alternative Mobile Crisis: 903-429-9287   [facility]  [address], [place], [place] [address]  [phone] or [phone]  [facility] of the [employer] (Domestic Violence, Rape & Victim Assistance)  ([phone]  [facility]  [address], [place]  [address]   [phone] or [phone]   [facility] [employer] [facility]: 7783643990 (8am-4pm) or (612) 212-99213217979910 (after hours)           [facility], [address], [place], [place]  [phone] Fax: [phone] [email or web address]  *Interpreters available *Accepts Medicaid, Medicare, uninsured  [facility]   Mon-Fri: 8am-5pm [address] [place], [place] [phone](IHWTU); [phone](fax) [email or web address]  *Accepts Medicare  [facility] [place], Fri: 8am-4pm [address] [place], [place]  [phone] (phone); [phone] (fax) ExShows.dk  *Accepts Medicare  [facility] Mon-Fri: 9am-5pm  [address], [place], [place] [phone] (phone); [phone]  MommyCollege.dk  *Accepts Medicaid  [facility] [address] [place], [place]  [phone]  https://www.grant.info/   [facility] [place], 8:30am-12pm/1pm-2:30pm [address], [place], [place] [phone] (phone); [phone] (fax) www.fspcares.org  *Accepts Medicaid, sliding-scale*Bilingual services available  [facility] Mon-Fri, 8am-7pm [address], [place], [place]  [phone](FYTWK); [phone](fax) www.[email or web address]  *Accepts Medicaid *Bilingual services available  [facility] Mon-Fri: 8am-5pm, Saturday by appointment only [address] [place], [place], [place] [phone] (phone); [phone] (fax) [email or web address]   [facility] [address], [employer] [place], [place] [phone] www.[email or web address]  *Free & reduced services for uninsured and underinsured individuals *Bilingual services for Spanish-speaking clients 21 and under  [facility], [address] [place], [place] [phone](YEBXI); [phone](fax) KittenExchange.[email or web address]  *Bring your own interpreter at first visit *Accepts Medicare and [facility]  [facility] Mon-Fri: 9am-5:30pm [address], [place], [place] [phone] (phone), [phone] (fax) After hours crisis line: [phone] www.[email or web address]  *Accepts Medicare and Medicaid  [employer], 8am-6pm [address] [place], [place]  [phone] (phone); [phone] (fax) http://[email or web address]  *Subsidized costs available  [facility] Mon-Fri: 8am-4pm [address] [place], [place] [phone](VJKQA);  [phone](fax) www.p[email or web address]  *Accepts Medicaid  [facility] Same day access hours: Mon-Fri, 8:30-3pm Crisis hours: Mon-Fri, 8am-5pm [address], [place], [place]  [facility] Same day access hours: Mon-Fri, 8:30-3pm Crisis hours: Mon-Fri, 8am-8pm [address] [place], [place] [phone] (phone); [phone] (fax) www.[email or web address]  *Accepts Medicaid and Medicare  [facility] [place], [place], Fri: 9am-9pm Tues, Thurs: 9am-6pm [address] [place], [place], [place]  [phone] (  phone); [phone] (fax) https://[email or web address]  *(Accepts Medicare and Medicaid; payment plans available)*Bilingual services available  [facility] [address], [place], [place] (phone); [phone] (fax) [email or web address] [address] [place], [place], [place]  [email or web address]  *Bilingual services available  [facility]) Mon-Thurs: 8am-8pm [address], [place], [place], [place] (phone); [phone] (fax) LostMillions.[email or web address]  *Accepts Medicaid*Bilingual services available  [facility] [address], [place], [place], [place] (phone) DeadConnect.com.cy  *Accepts Medicaid *Bilingual services available  [facility] Mon-Fri, 9am-4:45pm [address], [place], [place] (phone); [phone] (fax) http://[email or web address]-[email or web address]  *Accepts Medicare  [facility] Mon-Thurs: 8:30-8pm, Fri: 8:30am-7pm [address], [place], [place] (3rd floor) [phone] (phone); [phone] (fax) VIPinterview.[email or web address]  *Accepts Medicaid; income-based reduced rates available  [facility] Mon-Fri: 8am-5pm [address] [place], [place], [place] (phone);  [phone] (fax) http://www.[email or web address]  *Accepts Medicaid*Bilingual services available  [place], [place], [place], [place] (phone); [phone] (fax) www.youthfocus.org  *Free emergency housing and clinical services for youth in crisis  [facility])  [address] [place] [phone] www.[email or web address]  *Provides direct services to individuals in recovery from mental illness, including support groups, recovery skills classes, and one on one peer support  [employer] on [place]) [place] helpline: [phone]  https://namiguilford.org  *A community hub for information relating to local resources and services for the friends and families of individuals living alongside a mental health condition, as well as the individuals themselves. Classes and support groups also provided    /Emotional [employer] and Websites Here are a few free apps meant to help you to help yourself.  To find, try searching on the internet to see if the app is offered on Apple/Android devices. If your first choice doesn't come up on your device, the good news is that there are many choices! Play around with different apps to see which ones are helpful to you.    Calm This is an app meant to help increase calm feelings. Includes info, strategies, and tools for tracking your feelings.      Calm Harm  This app is meant to help with self-harm. Provides many 5-minute or 15-min coping strategies for doing instead of hurting yourself.       [place] is a problem-solving tool to help deal with emotions and cope with stress you encounter wherever you are.      MindShift This app can help people cope with anxiety. Rather than trying to avoid anxiety, you can make an important shift and face it.      MY3  MY3 features a support system, safety plan and resources with the goal  of offering a tool to use in a time of need.       My Life My Voice  This mood journal offers a simple solution for tracking your thoughts, feelings and moods. Animated emoticons can help identify your mood.       Relax Melodies Designed to help with sleep, on this app you can mix sounds and meditations for relaxation.      Smiling Mind Smiling Mind is meditation made easy: it's a simple tool that helps put a smile on your mind.        Stop, Breathe & Think  A friendly, simple guide for people through meditations for mindfulness and compassion.  Stop, Breathe and Think  Kids Enter your current feelings and choose a "mission" to help you cope. Offers videos for certain moods instead of just sound recordings.       Team Orange The goal of this tool is to help teens change how they think, act, and react. This app helps you focus on your own good feelings and experiences.      The United Stationers Box The United Stationers Box (VHB) contains simple tools to help patients with coping, relaxation, distraction, and positive thinking.

## 2019-07-04 NOTE — BH Specialist Note (Signed)
Integrated Behavioral Health via Telemedicine Video Visit  07/04/2019 Tracy [name] [id number]  Number of Integrated Behavioral Health visits: 3 Session Start time: 2:47  Session End time: 3:05 Total time: 18  Referring Provider: Thressa Sheller, CNM Type of Visit: Video Patient/Family location: [facility] Provider location: [facility] All persons participating in visit: Patient Tracy [name] and [facility] Tracy [name]    Confirmed patient's address: Yes  Confirmed patient's phone number: Yes  Any changes to demographics: No   Confirmed patient's insurance: Yes  Any changes to patient's insurance: No   Discussed confidentiality: At previous visit  I connected with Tracy [name] by a video enabled telemedicine application and verified that I am speaking with the correct person using two identifiers.     I discussed the limitations of evaluation and management by telemedicine and the availability of in person appointments.  I discussed that the purpose of this visit is to provide behavioral health care while limiting exposure to the novel coronavirus.   Discussed there is a possibility of technology failure and discussed alternative modes of communication if that failure occurs.  I discussed that engaging in this video visit, they consent to the provision of behavioral healthcare and the services will be billed under their insurance.  Patient and/or legal guardian expressed understanding and consented to video visit: Yes   PRESENTING CONCERNS: Patient and/or family reports the following symptoms/concerns: Pt states she is still attending [facility] for medication management (taking Zoloft 75mg ), but prefers other options for ongoing group and individual therapy.  Duration of problem: Ongoing; Severity of problem: moderate  STRENGTHS (Protective Factors/Coping Skills): Actively adhering to treatment via group therapy and medication; implements daily self-coping strateiges  GOALS  ADDRESSED: Patient will: 1.  Reduce symptoms of: anxiety and depression  2.  Demonstrate ability to: Increase healthy adjustment to current life circumstances  INTERVENTIONS: Interventions utilized:  Medication Monitoring and Psychoeducation and/or Health Education Standardized Assessments completed: GAD-7 and PHQ 9  ASSESSMENT: Patient currently experiencing Adjustment disorder with mixed anxiety and depression.   Patient may benefit from continued psychoeducation and brief therapeutic interventions regarding coping with symptoms of anxiety and depression .  PLAN: 1. Follow up with behavioral health clinician on : One month 2. Behavioral recommendations:  -Continue taking BH medication as prescribed -Continue taking prenatal vitamin daily -Continue attending scheduled visits with [facility]; consider additional resources discussed for ongoing therapy options -Continue using daily self-coping strategies that have been helpful in the past 3. Referral(s): Integrated (In Clinic)  I discussed the assessment and treatment plan with the patient and/or parent/guardian. They were provided an opportunity to ask questions and all were answered. They agreed with the plan and demonstrated an understanding of the instructions.   They were advised to call back or seek an in-person evaluation if the symptoms worsen or if the condition fails to improve as anticipated.  Tracy [name]  Depression screen [facility] 2/9 07/04/2019 05/03/2019 04/04/2019 03/23/2019  Decreased Interest 1 2 1 1   Down, Depressed, Hopeless 0 0 1 1  PHQ - 2 Score 1 2 2 2   Altered sleeping 3 3 3 3   Tired, decreased energy 3 3 3 3   Change in appetite 3 0 2 1  Feeling bad or failure about yourself  0 0 0 0  Trouble concentrating 0 0 0 1  Moving slowly or fidgety/restless 0 0 0 0  Suicidal thoughts 0 0 0 0  PHQ-9 Score 10 8 10 10    GAD 7 :  Generalized Anxiety Score 07/04/2019 05/03/2019 04/04/2019  03/23/2019  Nervous, Anxious, on Edge 3 2 3 3   Control/stop worrying 3 2 3 1   Worry too much - different things 3 2 3 1   Trouble relaxing 1 1 3 1   Restless 0 1 3 1   Easily annoyed or irritable 3 1 3 3   Afraid - awful might happen 1 1 3 1   Total GAD 7 Score 14 10 21  11

## 2019-07-25 ENCOUNTER — Encounter: Payer: Self-pay | Admitting: Student

## 2019-07-26 ENCOUNTER — Other Ambulatory Visit: Payer: Self-pay | Admitting: General Practice

## 2019-07-26 DIAGNOSIS — Z3493 Encounter for supervision of normal pregnancy, unspecified, third trimester: Secondary | ICD-10-CM

## 2019-07-27 ENCOUNTER — Ambulatory Visit (INDEPENDENT_AMBULATORY_CARE_PROVIDER_SITE_OTHER): Payer: Medicaid Other

## 2019-07-27 ENCOUNTER — Other Ambulatory Visit: Payer: Self-pay

## 2019-07-27 ENCOUNTER — Other Ambulatory Visit: Payer: Medicaid Other

## 2019-07-27 VITALS — BP 125/80 | HR 91

## 2019-07-27 DIAGNOSIS — Z3A28 28 weeks gestation of pregnancy: Secondary | ICD-10-CM

## 2019-07-27 DIAGNOSIS — Z23 Encounter for immunization: Secondary | ICD-10-CM | POA: Diagnosis not present

## 2019-07-27 DIAGNOSIS — O99343 Other mental disorders complicating pregnancy, third trimester: Secondary | ICD-10-CM

## 2019-07-27 DIAGNOSIS — Z3493 Encounter for supervision of normal pregnancy, unspecified, third trimester: Secondary | ICD-10-CM

## 2019-07-27 DIAGNOSIS — O219 Vomiting of pregnancy, unspecified: Secondary | ICD-10-CM

## 2019-07-27 DIAGNOSIS — F419 Anxiety disorder, unspecified: Secondary | ICD-10-CM

## 2019-07-27 DIAGNOSIS — Z3492 Encounter for supervision of normal pregnancy, unspecified, second trimester: Secondary | ICD-10-CM

## 2019-07-27 LAB — GLUCOSE, CAPILLARY: Glucose-Capillary: 94 mg/dL (ref 70–99)

## 2019-07-27 MED ORDER — ONDANSETRON 4 MG PO TBDP: 4.0000 mg | ORAL_TABLET | Freq: Four times a day (QID) | ORAL | 1 refills | Status: DC | PRN

## 2019-07-27 MED ORDER — SERTRALINE HCL 50 MG PO TABS: 150.0000 mg | ORAL_TABLET | Freq: Every day | ORAL | 3 refills | Status: DC

## 2019-07-27 NOTE — Patient Instructions (Signed)
Blood Glucose Monitoring, Adult Monitoring your blood sugar (glucose) is an important part of managing your diabetes (diabetes mellitus). Blood glucose monitoring involves checking your blood glucose as often as directed and keeping a record (log) of your results over time. Checking your blood glucose regularly and keeping a blood glucose log can:  Help you and your health care provider adjust your diabetes management plan as needed, including your medicines or insulin.  Help you understand how food, exercise, illnesses, and medicines affect your blood glucose.  Let you know what your blood glucose is at any time. You can quickly find out if you have low blood glucose (hypoglycemia) or high blood glucose (hyperglycemia). Your health care provider will set individualized treatment goals for you. Your goals will be based on your age, other medical conditions you have, and how you respond to diabetes treatment. Generally, the goal of treatment is to maintain the following blood glucose levels:  Before meals (preprandial): 80-130 mg/dL (4.4-7.2 mmol/L).  After meals (postprandial): below 180 mg/dL (10 mmol/L).  A1c level: less than 7%. Supplies needed:  Blood glucose meter.  Test strips for your meter. Each meter has its own strips. You must use the strips that came with your meter.  A needle to prick your finger (lancet). Do not use a lancet more than one time.  A device that holds the lancet (lancing device).  A journal or log book to write down your results. How to check your blood glucose  1. Wash your hands with soap and water. 2. Prick the side of your finger (not the tip) with the lancet. Use a different finger each time. 3. Gently rub the finger until a small drop of blood appears. 4. Follow instructions that come with your meter for inserting the test strip, applying blood to the strip, and using your blood glucose meter. 5. Write down your result and any notes. Some meters  allow you to use areas of your body other than your finger (alternative sites) to test your blood. The most common alternative sites are:  Forearm.  Thigh.  Palm of the hand. If you think you may have hypoglycemia, or if you have a history of not knowing when your blood glucose is getting low (hypoglycemia unawareness), do not use alternative sites. Use your finger instead. Alternative sites may not be as accurate as the fingers, because blood flow is slower in these areas. This means that the result you get may be delayed, and it may be different from the result that you would get from your finger. Follow these instructions at home: Blood glucose log   Every time you check your blood glucose, write down your result. Also write down any notes about things that may be affecting your blood glucose, such as your diet and exercise for the day. This information can help you and your health care provider: ? Look for patterns in your blood glucose over time. ? Adjust your diabetes management plan as needed.  Check if your meter allows you to download your records to a computer. Most glucose meters store a record of glucose readings in the meter. If you have type 1 diabetes:  Check your blood glucose 2 or more times a day.  Also check your blood glucose: ? Before every insulin injection. ? Before and after exercise. ? Before meals. ? 2 hours after a meal. ? Occasionally between 2:00 a.m. and 3:00 a.m., as directed. ? Before potentially dangerous tasks, like driving or using heavy machinery. ?   At bedtime.  You may need to check your blood glucose more often, up to 6-10 times a day, if you: ? Use an insulin pump. ? Need multiple daily injections (MDI). ? Have diabetes that is not well-controlled. ? Are ill. ? Have a history of severe hypoglycemia. ? Have hypoglycemia unawareness. If you have type 2 diabetes:  If you take insulin or other diabetes medicines, check your blood glucose 2 or  more times a day.  If you are on intensive insulin therapy, check your blood glucose 4 or more times a day. Occasionally, you may also need to check between 2:00 a.m. and 3:00 a.m., as directed.  Also check your blood glucose: ? Before and after exercise. ? Before potentially dangerous tasks, like driving or using heavy machinery.  You may need to check your blood glucose more often if: ? Your medicine is being adjusted. ? Your diabetes is not well-controlled. ? You are ill. General tips  Always keep your supplies with you.  If you have questions or need help, all blood glucose meters have a 24-hour "hotline" phone number that you can call. You may also contact your health care provider.  After you use a few boxes of test strips, adjust (calibrate) your blood glucose meter by following instructions that came with your meter. Contact a health care provider if:  Your blood glucose is at or above 240 mg/dL (13.3 mmol/L) for 2 days in a row.  You have been sick or have had a fever for 2 days or longer, and you are not getting better.  You have any of the following problems for more than 6 hours: ? You cannot eat or drink. ? You have nausea or vomiting. ? You have diarrhea. Get help right away if:  Your blood glucose is lower than 54 mg/dL (3 mmol/L).  You become confused or you have trouble thinking clearly.  You have difficulty breathing.  You have moderate or large ketone levels in your urine. Summary  Monitoring your blood sugar (glucose) is an important part of managing your diabetes (diabetes mellitus).  Blood glucose monitoring involves checking your blood glucose as often as directed and keeping a record (log) of your results over time.  Your health care provider will set individualized treatment goals for you. Your goals will be based on your age, other medical conditions you have, and how you respond to diabetes treatment.  Every time you check your blood glucose,  write down your result. Also write down any notes about things that may be affecting your blood glucose, such as your diet and exercise for the day. This information is not intended to replace advice given to you by your health care provider. Make sure you discuss any questions you have with your health care provider. Document Revised: 02/12/2018 Document Reviewed: 10/01/2015 Elsevier Patient Education  2020 Elsevier Inc.  

## 2019-07-27 NOTE — Progress Notes (Signed)
PRENATAL VISIT NOTE  Subjective:  Tracy [name] is a 24 y.o. G1P0 at [redacted]w[redacted]d who presents today for routine prenatal care.  She is currently being monitored for supervision of a high-risk pregnancy with problems as listed below.  Patient endorses fetal movement.  She denies vaginal concerns including discharge, bleeding, leaking, itching, and burning. Patient states she had a "jellyish discharge on Monday and messaged the clinic and knows now that it is okay."  Patient endorses sexual activity prior to this incident.    Patient reports feeling anxious and states that she "gets worked up easily and it can be overwhelming."  Patient is reports that she copes well sometimes and is tearful during discussion.    Patient reports that she has "really bad anxiety about eating" and states this as the reason for not wanting to complete her glucola.  Patient states she would rather just "prick my finger everyday."  Patient further reports that she is still having "lots nausea and throwing up a lot and I think this is why I get anxious before I eat."   Patient Active Problem List   Diagnosis Date Noted  . Rubella non-immune status, antepartum 04/05/2019  . Tetrahydrocannabinol (THC) use disorder, mild, abuse 04/04/2019  . Supervision of low-risk pregnancy 03/23/2019  . Obesity in pregnancy, antepartum 03/23/2019  . Depression   . Anxiety   . Asthma   . Allergic rhinoconjunctivitis 03/22/2015  . Moderate persistent asthma 03/22/2015  . Nausea alone 07/07/2013  . Substernal pain 07/06/2013  . Right sided abdominal pain     The following portions of the patient's history were reviewed and updated as appropriate: allergies, current medications, past family history, past medical history, past social history, past surgical history and problem list. Problem list updated.  Objective:   Vitals:   07/27/19 0930  BP: 125/80  Pulse: 91    Fetal Status: Fetal Heart Rate (bpm): 145 Fundal Height: 29 cm  Movement: Present     General:  Alert, oriented and cooperative. Patient is in no acute distress.  Skin: Skin is warm and dry.   Cardiovascular: Regular rate and rhythm.  Respiratory: Normal respiratory effort. CTA-Bilaterally  Abdomen: Soft, gravid, appropriate for gestational age.  Pelvic: Cervical exam deferred        Extremities: Normal range of motion.  Edema: None  Mental Status: Normal mood and affect. Normal behavior. Normal judgment and thought content.   Assessment and Plan:  Pregnancy: G1P0 at [redacted]w[redacted]d  1. Encounter for supervision of low-risk pregnancy in second trimester -Anticipatory guidance for upcoming appts. -Reviewed desire to not complete GTT. -Dr. Fransico Meadow consulted and advised: *Obtain fasting now. *Have patient return to office on 3 separate and random occasions for lab only visit and collection. -Will make next appt in office to assess mood after Zoloft increase.  -Fasting today 94  2. Anxiety -Agreeable to increasing Zoloft for anxiety. -Rx changed from 100mg  to 150mg  daily.  -Reiterated that Zoloft should not be titrated and discontinued prior to delivery  3. Nausea and vomiting during pregnancy -Rx for Zofran 4mg  ODT to be sent to pharmacy on file. -Instructed to take 30-45 minutes prior to eating to help with nausea and increase appetite.     Preterm labor symptoms and general obstetric precautions including but not limited to vaginal bleeding, contractions, leaking of fluid and fetal movement were reviewed with the patient.  Please refer to After Visit Summary for other counseling recommendations.  No follow-ups on file.  Future Appointments  Date Time Provider Department Center  07/27/2019 10:15 AM Gerrit Heck, CNM [facility]  08/01/2019  1:15 PM [facility]  09/12/2019  1:30 PM Kozlow, Alvira Philips, MD [facility] None    Cherre Robins, CNM 07/27/2019, 9:59 AM

## 2019-07-28 LAB — CBC
Hematocrit: 36.2 % (ref 34.0–46.6)
Hemoglobin: 12.2 g/dL (ref 11.1–15.9)
MCH: 30.6 pg (ref 26.6–33.0)
MCHC: 33.7 g/dL (ref 31.5–35.7)
MCV: 91 fL (ref 79–97)
Platelets: 255 10*3/uL (ref 150–450)
RBC: 3.99 x10E6/uL (ref 3.77–5.28)
RDW: 13.1 % (ref 11.7–15.4)
WBC: 13.2 10*3/uL — ABNORMAL HIGH (ref 3.4–10.8)

## 2019-07-28 LAB — GLUCOSE TOLERANCE, 2 HOURS W/ 1HR: Glucose, Fasting: 83 mg/dL (ref 65–91)

## 2019-07-28 LAB — HIV ANTIBODY (ROUTINE TESTING W REFLEX): HIV Screen 4th Generation wRfx: NONREACTIVE

## 2019-07-28 LAB — RPR: RPR Ser Ql: NONREACTIVE

## 2019-08-01 ENCOUNTER — Encounter (HOSPITAL_COMMUNITY): Payer: Self-pay

## 2019-08-01 ENCOUNTER — Other Ambulatory Visit: Payer: Self-pay | Admitting: Student

## 2019-08-01 ENCOUNTER — Ambulatory Visit (HOSPITAL_COMMUNITY): Payer: Medicaid Other | Admitting: *Deleted

## 2019-08-01 ENCOUNTER — Ambulatory Visit (HOSPITAL_COMMUNITY)
Admission: RE | Admit: 2019-08-01 | Discharge: 2019-08-01 | Disposition: A | Discharge status: Home / Self Care | Payer: Medicaid Other | Source: Ambulatory Visit | Attending: Student | Admitting: Student

## 2019-08-01 ENCOUNTER — Other Ambulatory Visit: Payer: Self-pay

## 2019-08-01 ENCOUNTER — Other Ambulatory Visit (HOSPITAL_COMMUNITY): Payer: Self-pay | Admitting: *Deleted

## 2019-08-01 ENCOUNTER — Ambulatory Visit (INDEPENDENT_AMBULATORY_CARE_PROVIDER_SITE_OTHER): Payer: Medicaid Other | Admitting: Clinical

## 2019-08-01 DIAGNOSIS — E669 Obesity, unspecified: Secondary | ICD-10-CM

## 2019-08-01 DIAGNOSIS — F3289 Other specified depressive episodes: Secondary | ICD-10-CM

## 2019-08-01 DIAGNOSIS — O99343 Other mental disorders complicating pregnancy, third trimester: Secondary | ICD-10-CM

## 2019-08-01 DIAGNOSIS — Z3492 Encounter for supervision of normal pregnancy, unspecified, second trimester: Secondary | ICD-10-CM | POA: Diagnosis not present

## 2019-08-01 DIAGNOSIS — J45909 Unspecified asthma, uncomplicated: Secondary | ICD-10-CM | POA: Diagnosis present

## 2019-08-01 DIAGNOSIS — Z362 Encounter for other antenatal screening follow-up: Secondary | ICD-10-CM | POA: Diagnosis not present

## 2019-08-01 DIAGNOSIS — F419 Anxiety disorder, unspecified: Secondary | ICD-10-CM | POA: Diagnosis present

## 2019-08-01 DIAGNOSIS — Z3A28 28 weeks gestation of pregnancy: Secondary | ICD-10-CM

## 2019-08-01 DIAGNOSIS — F4323 Adjustment disorder with mixed anxiety and depressed mood: Secondary | ICD-10-CM

## 2019-08-01 DIAGNOSIS — O99210 Obesity complicating pregnancy, unspecified trimester: Secondary | ICD-10-CM | POA: Insufficient documentation

## 2019-08-01 DIAGNOSIS — O99213 Obesity complicating pregnancy, third trimester: Secondary | ICD-10-CM | POA: Diagnosis not present

## 2019-08-01 NOTE — BH Specialist Note (Signed)
Integrated Behavioral Health via Telemedicine Video Visit  08/01/2019 Tracy [name] [id number]  Number of [place] visits: 4 Session Start time: 1:18  Session End time: 1:28 Total time: 10  Referring Provider: Marcille Buffy, CNM Type of Visit: Video Patient/Family location: Home [facility] Provider location: [facility] All persons participating in visit: Patient Tracy [name] and [place]    Confirmed patient's address: Yes  Confirmed patient's phone number: Yes  Any changes to demographics: No   Confirmed patient's insurance: Yes  Any changes to patient's insurance: No   Discussed confidentiality: at previous visit   I connected with Tracy [name] by a video enabled telemedicine application and verified that I am speaking with the correct person using two identifiers.     I discussed the limitations of evaluation and management by telemedicine and the availability of in person appointments.  I discussed that the purpose of this visit is to provide behavioral health care while limiting exposure to the novel coronavirus.   Discussed there is a possibility of technology failure and discussed alternative modes of communication if that failure occurs.  I discussed that engaging in this video visit, they consent to the provision of behavioral healthcare and the services will be billed under their insurance.  Patient and/or legal guardian expressed understanding and consented to video visit: Yes   PRESENTING CONCERNS: Patient and/or family reports the following symptoms/concerns: Pt states her primary concern today is continued nausea/no change from last visit; is continuing to attend therapy at [facility], and taking Zoloft; attributes symptom increase to nausea/vomiting, and physical discomfort affecting sleep.  Duration of problem: Increase in current pregnancy; Severity of problem: moderate  STRENGTHS (Protective Factors/Coping Skills): Adhering to treatment  via medication and therapy  GOALS ADDRESSED: Patient will: 1.  Reduce symptoms of: anxiety and depression  2.  Increase knowledge and/or ability of: healthy habits  3.  Demonstrate ability to: Increase healthy adjustment to current life circumstances  INTERVENTIONS: Interventions utilized:  Supportive Counseling, Psychoeducation and/or Health Education and Link to [employer] Standardized Assessments completed: Not given today  ASSESSMENT: Patient currently experiencing Adjustment disorder with mixed anxious and depressed mood.   Patient may benefit from continued psychoeducation and brief therapeutic interventions regarding coping with symptoms of anxiety and depression .  PLAN: 1. Follow up with behavioral health clinician on : Two months for brief check in, or call Roselyn Reef at [phone] if needed prior to that time 2. Behavioral recommendations:  -Read through Postpartum Planner sent to After Visit Summary -Consider taking medication at lunchtime, if nausea is less at that time -Continue taking Zoloft and prenatal vitamin daily, as prescribed -Continue attending [facility] therapy  3. Referral(s): [place] (In Clinic)  I discussed the assessment and treatment plan with the patient and/or parent/guardian. They were provided an opportunity to ask questions and all were answered. They agreed with the plan and demonstrated an understanding of the instructions.   They were advised to call back or seek an in-person evaluation if the symptoms worsen or if the condition fails to improve as anticipated.  Caroleen Hamman [facility]  Depression screen [facility] 2/9 07/27/2019 07/04/2019 05/03/2019 04/04/2019 03/23/2019  Decreased Interest 2 1 2 1 1   Down, Depressed, Hopeless 1 0 0 1 1  PHQ - 2 Score 3 1 2 2 2   Altered sleeping 3 3 3 3 3   Tired, decreased energy 3 3 3 3 3   Change in appetite 3 3 0 2 1  Feeling bad or  failure about yourself  0 0 0 0 0  Trouble  concentrating 0 0 0 0 1  Moving slowly or fidgety/restless 0 0 0 0 0  Suicidal thoughts 0 0 0 0 0  PHQ-9 Score 12 10 8 10 10    GAD 7 : Generalized Anxiety Score 07/27/2019 07/04/2019 05/03/2019 04/04/2019  Nervous, Anxious, on Edge 3 3 2 3   Control/stop worrying 3 3 2 3   Worry too much - different things 3 3 2 3   Trouble relaxing 3 1 1 3   Restless 3 0 1 3  Easily annoyed or irritable 3 3 1 3   Afraid - awful might happen 3 1 1 3   Total GAD 7 Score 21 14 10  21

## 2019-08-01 NOTE — Patient Instructions (Signed)
BRAINSTORMING  Develop a Plan Goals: . Provide a way to start conversation about your new life with a baby . Assist parents in recognizing and using resources within their reach . Help pave the way before birth for an easier period of transition afterwards.  Make a list of the following information to keep in a central location: . Full name of Mom and Partner: _____________________________________________ . 34 full name and Date of Birth: ___________________________________________ . Home Address: ___________________________________________________________ ________________________________________________________________________ . Home Phone: ____________________________________________________________ . Parents' cell numbers: _____________________________________________________ ________________________________________________________________________ . Name and contact info for OB: ______________________________________________ . Name and contact info for Pediatrician:________________________________________ . Contact info for Lactation Consultants: ________________________________________  REST and SLEEP *You each need at least 4-5 hours of uninterrupted sleep every day. Write specific names and contact information.* . How are you going to rest in the postpartum period? While partner's home? When partner returns to work? When you both return to work? Marland Kitchen Where will your baby sleep? Marland Kitchen Who is available to help during the day? Evening? Night? . Who could move in for a period to help support you? Marland Kitchen What are some ideas to help you get enough sleep? __________________________________________________________________________________________________________________________________________________________________________________________________________________________________________ NUTRITIOUS FOOD AND DRINK *Plan for meals before your baby is born so you can have healthy food to eat  during the immediate postpartum period.* . Who will look after breakfast? Lunch? Dinner? List names and contact information. Brainstorm quick, healthy ideas for each meal. . What can you do before baby is born to prepare meals for the postpartum period? . How can others help you with meals? Marland Kitchen Which grocery stores provide online shopping and delivery? Marland Kitchen Which restaurants offer take-out or delivery options? ______________________________________________________________________________________________________________________________________________________________________________________________________________________________________________________________________________________________________________________________________________________________________________________________________  CARE FOR MOM *It's important that mom is cared for and pampered in the postpartum period. Remember, the most important ways new mothers need care are: sleep, nutrition, gentle exercise, and time off.* . Who can come take care of mom during this period? Make a list of people with their contact information. . List some activities that make you feel cared for, rested, and energized? Who can make sure you have opportunities to do these things? . Does mom have a space of her very own within your home that's just for her? Make a "Circles Of Care" where she can be comfortable, rest, and renew herself daily. ______________________________________________________________________________________________________________________________________________________________________________________________________________________________________________________________________________________________________________________________________________________________________________________________________    CARE FOR AND FEEDING BABY *Knowledgeable and encouraging people will offer the best support with regard to feeding your  baby.* . Educate yourself and choose the best feeding option for your baby. . Make a list of people who will guide, support, and be a resource for you as your care for and feed your baby. (Friends that have breastfed or are currently breastfeeding, lactation consultants, breastfeeding support groups, etc.) . Consider a postpartum doula. (These websites can give you information: dona.org & BuyingShow.es) . Seek out local breastfeeding resources like the breastfeeding support group at [employer] or [employer]. ______________________________________________________________________________________________________________________________________________________________________________________________________________________________________________________________________________________________________________________________________________________________________________________________________  Verner Chol AND ERRANDS . Who can help with a thorough cleaning before baby is born? . Make a list of people who will help with housekeeping and chores, like laundry, light cleaning, dishes, bathrooms, etc. . Who can run some errands for you? Marland Kitchen What can you do to make sure you are stocked with basic supplies before baby is born? . Who is going to do the shopping? ______________________________________________________________________________________________________________________________________________________________________________________________________________________________________________________________________________________________________________________________________________________________________________________________________     Family Adjustment *Nurture yourselves.it helps parents be more loving and allows for better bonding with their child.* .  What sorts of things do you and partner enjoy doing together? Which activities help you to connect and strengthen your relationship? Make a list of  those things. Make a list of people whom you trust to care for your baby so you can have some time together as a couple. . What types of things help partner feel connected to Mom? Make a list. . What needs will partner have in order to bond with baby? . Other children? Who will care for them when you go into labor and while you are in the hospital? . Think about what the needs of your older children might be. Who can help you meet those needs? In what ways are you helping them prepare for bringing baby home? List some specific strategies you have for family adjustment. _______________________________________________________________________________________________________________________________________________________________________________________________________________________________________________________________________________________________________________________________________________  SUPPORT *Someone who can empathize with experiences normalizes your problems and makes them more bearable.* . Make a list of other friends, neighbors, and/or co-workers you know with infants (and small children, if applicable) with whom you can connect. . Make a list of local or online support groups, mom groups, etc. in which you can be involved. ______________________________________________________________________________________________________________________________________________________________________________________________________________________________________________________________________________________________________________________________________________________________________________________________________  Childcare Plans . Investigate and plan for childcare if mom is returning to work. . Talk about mom's concerns about her transition back to work. . Talk about partner's concerns regarding this transition.  Mental Health *Your mental health is one of the highest priorities for  a pregnant or postpartum mom.* . 1 in 5 women experience anxiety and/or depression from the time of conception through the first year after birth. . Postpartum Mood Disorders are the #1 complication of pregnancy and childbirth and the suffering experienced by these mothers is not necessary! These illnesses are temporary and respond well to treatment, which often includes self-care, social support, talk therapy, and medication when needed. . Women experiencing anxiety and depression often say things like: "I'm supposed to be happy.why do I feel so sad?", "Why can't I snap out of it?", "I'm having thoughts that scare me." . There is no need to be embarrassed if you are feeling these symptoms: o Overwhelmed, anxious, angry, sad, guilty, irritable, hopeless, exhausted but can't sleep o You are NOT alone. You are NOT to blame. With help, you WILL be well. . Where can I find help? Medical professionals such as your OB, midwife, gynecologist, family practitioner, primary care provider, pediatrician, or mental health providers; [facility] support groups: Feelings After Birth, Breastfeeding Support Group, Baby and Me Group, and Fit 4 Two exercise classes. . You have permission to ask for help. It will confirm your feelings, validate your experiences, share/learn coping strategies, and gain support and encouragement as you heal. You are important! BRAINSTORM . Make a list of local resources, including resources for mom and for partner. . Identify support groups. . Identify people to call late at night - include names and contact info. . Talk with partner about perinatal mood and anxiety disorders. . Talk with your OB, midwife, and doula about baby blues and about perinatal mood and anxiety disorders. . Talk with your pediatrician about perinatal mood and anxiety disorders.   Support & Sanity Savers   What do you really need?  . Basics . In preparing for a new baby, many expectant parents spend  hours shopping for baby clothes, decorating the nursery, and deciding which car seat to buy. Yet most don't think much about what the reality of parenting a newborn will be like, and what they need to make  it through that. So, here is the advice of experienced parents. We know you'll read this, and think "they're exaggerating, I don't really need that." Just trust [place] on these, OK? Plan for all of this, and if it turns out you don't need it, come back and teach [place] how you did it!  Satira Anis (Once baby's survival needs are met, make sure you attend to your own survival needs!) . Sleep . An average newborn sleeps 16-18 hours per day, over 6-7 sleep periods, rarely more than three hours at a time. It is normal and healthy for a newborn to wake throughout the night... but really hard on parents!! . Naps. Prioritize sleep above any responsibilities like: cleaning house, visiting friends, running errands, etc.  Sleep whenever baby sleeps. If you can't nap, at least have restful times when baby eats. The more rest you get, the more patient you will be, the more emotionally stable, and better at solving problems.  . Food . You may not have realized it would be difficult to eat when you have a newborn. Yet, when we talk to . countless new parents, they say things like "it may be 2:00 pm when I realize I haven't had breakfast yet." Or "every time we sit down to dinner, baby needs to eat, and my food gets cold, so I don't bother to eat it." . Finger food. Before your baby is born, stock up with one months' worth of food that: 1) you can eat with one hand while holding a baby, 2) doesn't need to be prepped, 3) is good hot or cold, 4) doesn't spoil when left out for a few hours, and 5) you like to eat. Think about: nuts, dried fruit, Clif bars, pretzels, jerky, gogurt, baby carrots, apples, bananas, crackers, cheez-n-crackers, string cheese, hot pockets or frozen burritos to microwave, garden burgers and breakfast  pastries to put in the toaster, yogurt drinks, etc. . Restaurant Menus. Make lists of your favorite restaurants & menu items. When family/friends want to help, you can give specific information without much thought. They can either bring you the food or send gift cards for just the right meals. Rosaura Carpenter Meals.  Take some time to make a few meals to put in the freezer ahead of time.  Easy to freeze meals can be anything such as soup, lasagna, chicken pie, or spaghetti sauce. . Set up a Meal Schedule.  Ask friends and family to sign up to bring you meals during the first few weeks of being home. (It can be passed around at baby showers!) You have no idea how helpful this will be until you are in the throes of parenting.  https://hamilton-woodard.com/ is a great website to check out. . Emotional Support . Know who to call when you're stressed out. Parenting a newborn is very challenging work. There are times when it totally overwhelms your normal coping abilities. EVERY NEW PARENT NEEDS TO HAVE A PLAN FOR WHO TO CALL WHEN THEY JUST CAN'T COPE ANY MORE. (And it has to be someone other than the baby's other parent!) Before your baby is born, come up with at least one person you can call for support - write their phone number down and post it on the refrigerator. Marland Kitchen Anxiety & Sadness. Baby blues are normal after pregnancy; however, there are more severe types of anxiety & sadness which can occur and should not be ignored.  They are always treatable, but you have to take the first step by  reaching out for help. [facility] offers a "Mom Talk" group which meets every Tuesday from 10 am - 11 am.  This group is for new moms who need support and connection after their babies are born.  Call [phone].  Marland Kitchen Really, Really Helpful (Plan for them! Make sure these happen often!!) . Physical Support with Taking Care of Yourselves . Asking friends and family. Before your baby is born, set up a schedule of people who can come  and visit and help out (or ask a friend to schedule for you). Any time someone says "let me know what I can do to help," sign them up for a day. When they get there, their job is not to take care of the baby (that's your job and your joy). Their job is to take care of you!  . Postpartum doulas. If you don't have anyone you can call on for support, look into postpartum doulas:  professionals at helping parents with caring for baby, caring for themselves, getting breastfeeding started, and helping with household tasks. www.padanc.org is a helpful website for learning about doulas in our area. . Peer Support / Parent Groups . Why: One of the greatest ideas for new parents is to be around other new parents. Parent groups give you a chance to share and listen to others who are going through the same season of life, get a sense of what is normal infant development by watching several babies learn and grow, share your stories of triumph and struggles with empathetic ears, and forgive your own mistakes when you realize all parents are learning by trial and error. . Where to find: There are many places you can meet other new parents throughout our community.  [facility] offers the following classes for new moms and their little ones:  Baby and Me (Birth to [place]) and Breastfeeding Support Group. Go to www.conehealthybaby.com or call [phone] for more information. . Time for your Relationship . It's easy to get so caught up in meeting baby's immediate needs that it's hard to find time to connect with your partner, and meet the needs of your relationship. It's also easy to forget what "quality time with your partner" actually looks like. If you take your baby on a date, you'd be amazed how much of your couple time is spent feeding the baby, diapering the baby, admiring the baby, and talking about the baby. . Dating: Try to take time for just the two of you. Babysitter tip: Sometimes when moms are  breastfeeding a newborn, they find it hard to figure out how to schedule outings around baby's unpredictable feeding schedules. Have the babysitter come for a three hour period. When she comes over, if baby has just eaten, you can leave right away, and come back in two hours. If baby hasn't fed recently, you start the date at home. Once baby gets hungry and gets a good feeding in, you can head out for the rest of your date time. . Date Nights at Home: If you can't get out, at least set aside one evening a week to prioritize your relationship: whenever baby dozes off or doesn't have any immediate needs, spend a little time focusing on each other. . Potential conflicts: The main relationship conflicts that come up for new parents are: issues related to sexuality, financial stresses, a feeling of an unfair division of household tasks, and conflicts in parenting styles. The more you can work on these issues before baby arrives, the better!  Marland Kitchen  Fun and Frills (Don't forget these. and don't feel guilty for indulging in them!) . Everyone has something in life that is a fun little treat that they do just for themselves. It may be: reading the morning paper, or going for a daily jog, or having coffee with a friend once a week, or going to a movie on Friday nights, or fine chocolates, or bubble baths, or curling up with a good book. . Unless you do fun things for yourself every now and then, it's hard to have the energy for fun with your baby. Whatever your "special" treats are, make sure you find a way to continue to indulge in them after your baby is born. These special moments can recharge you, and allow you to return to baby with a new joy   PERINATAL MOOD DISORDERS: [place]   Emergency and Crisis Resources:  If you are an imminent risk to self or others, are experiencing intense personal distress, and/or have noticed significant changes in activities  of daily living, call:  . 911 . [facility]: [phone] . Mobile Crisis: 762-699-2271 . National Suicide Hotline: 661-749-5115 Or visit the following crisis centers: . Local Emergency Departments . [facility]: [address] [place]. Hours: 8:30AM-5PM. Insurance Accepted: Medicaid, Medicare, and Uninsured.  Marland Kitchen [facility]  [address] [place] Mon-Friday 8am-3pm  [phone]                                                                                    Non-Crisis Resources: To identify specific providers that are covered by your insurance, contact your insurance company or local agencies: [place]: 9515508358 [facility] and [place]: [phone] [place]: [phone] Postpartum Support International- [place] 1-512-517-8751                                                      Outpatient therapy and medication management providers:  [facility] [phone] Hours: 9AM-5PM  Insurance Accepted: Alben Spittle, Lorella Nimrod, Freddrick March, [place], Medicare  [facility] ([place]) [phone] Hours: 8AM-5PM  nsurance Accepted: All insurances EXCEPT AARP, [place], [place], and [place]: [phone]             Hours: 8AM-8PM Insurance Accepted: Cristal Ford, Freddrick March, [place], Medicare, [phone] [facility]: [phone] Hours: 8:30AM-7PM Insurance Accepted: Cristal Ford, Medicaid, Medicare, Tricare, [employer] Counseling:  [place] Accepted:  Holland Falling, Lorella Nimrod, [employer], [place], [facility] [phone] Hours: 9AM-5:30PM Insurance Accepted: Alben Spittle, Charlotte Crumb, and Medicaid, Medicare, [employer] [facility] Counseling:   [phone] Hours: 9am-5pm Insurance Accepted: BCBS; they do not accept Medicaid/Medicare [place]: [phone] Hours: 9am-9pm Insurance Accepted: All major insurance including Medicaid and Medicare [facility]: [phone] Hours: 9AM-5:30PM Insurance  Accepted: All insurances EXCEPT Medicaid and Medicare. [facility]: [place]: [phone] [place]:  [place] (support for children in the NICU and/or with special needs), [place] [employer]: [phone]                                                                                     Online Resources: Postpartum Support International: http://jones-berg.com/  800-944-4PPD 2Moms Supporting Moms:  www.momssupportingmoms.net

## 2019-08-03 ENCOUNTER — Encounter: Payer: Self-pay | Admitting: Student

## 2019-08-18 ENCOUNTER — Encounter: Payer: Medicaid Other | Admitting: Obstetrics and Gynecology

## 2019-08-19 ENCOUNTER — Encounter: Payer: Self-pay | Admitting: Medical

## 2019-08-19 ENCOUNTER — Ambulatory Visit (INDEPENDENT_AMBULATORY_CARE_PROVIDER_SITE_OTHER): Payer: Medicaid Other | Admitting: Medical

## 2019-08-19 ENCOUNTER — Other Ambulatory Visit: Payer: Self-pay

## 2019-08-19 VITALS — BP 120/81 | HR 88 | Wt 167.9 lb

## 2019-08-19 DIAGNOSIS — Z283 Underimmunization status: Secondary | ICD-10-CM

## 2019-08-19 DIAGNOSIS — Z3493 Encounter for supervision of normal pregnancy, unspecified, third trimester: Secondary | ICD-10-CM

## 2019-08-19 DIAGNOSIS — O99513 Diseases of the respiratory system complicating pregnancy, third trimester: Secondary | ICD-10-CM

## 2019-08-19 DIAGNOSIS — J4540 Moderate persistent asthma, uncomplicated: Secondary | ICD-10-CM

## 2019-08-19 DIAGNOSIS — O99213 Obesity complicating pregnancy, third trimester: Secondary | ICD-10-CM

## 2019-08-19 DIAGNOSIS — O99210 Obesity complicating pregnancy, unspecified trimester: Secondary | ICD-10-CM

## 2019-08-19 DIAGNOSIS — Z3A31 31 weeks gestation of pregnancy: Secondary | ICD-10-CM

## 2019-08-19 DIAGNOSIS — O09899 Supervision of other high risk pregnancies, unspecified trimester: Secondary | ICD-10-CM

## 2019-08-19 NOTE — Progress Notes (Signed)
   PRENATAL VISIT NOTE  Subjective:  Tracy [name] is a 24 y.o. G1P0 at 38w3dbeing seen today for ongoing prenatal care.  She is currently monitored for the following issues for this high-risk pregnancy and has Right sided abdominal pain; Substernal pain; Nausea alone; Moderate persistent asthma; Supervision of low-risk pregnancy; Obesity in pregnancy, antepartum; Depression; Anxiety; Asthma; Tetrahydrocannabinol (THC) use disorder, mild, abuse; and Rubella non-immune status, antepartum on their problem list.  Patient reports nausea.  Contractions: Not present. Vag. Bleeding: None.  Movement: Present. Denies leaking of fluid.   The following portions of the patient's history were reviewed and updated as appropriate: allergies, current medications, past family history, past medical history, past social history, past surgical history and problem list.   Objective:   Vitals:   08/19/19 0849  BP: 120/81  Pulse: 88  Weight: 167 lb 14.4 oz (76.2 kg)    Fetal Status: Fetal Heart Rate (bpm): 156 Fundal Height: 30 cm Movement: Present     General:  Alert, oriented and cooperative. Patient is in no acute distress.  Skin: Skin is warm and dry. No rash noted.   Cardiovascular: Normal heart rate noted  Respiratory: Normal respiratory effort, no problems with respiration noted  Abdomen: Soft, gravid, appropriate for gestational age.  Pain/Pressure: Present     Pelvic: Cervical exam deferred        Extremities: Normal range of motion.  Edema: Trace  Mental Status: Normal mood and affect. Normal behavior. Normal judgment and thought content.   Assessment and Plan:  Pregnancy: G1P0 at 315w3d. Encounter for supervision of low-risk pregnancy in third trimester - Doing well - Still having nausea often and vomiting most days - Zofran helps - Small frequent snacks recommended, avoid overeating  - 7# weight loss noted  - Refused third trimester GTT at last visit  - Plan to check fasting and 3  random CBG in office  - Fasting at last visit was 95 - Unable to check today due to issue with glucometer  - EFW 10% at 21 and 28 weeks, follow-up growth [place]/19/21  2. Rubella non-immune status, antepartum - Plan for PP MMR  3. Obesity in pregnancy, antepartum  4. Moderate persistent asthma, unspecified whether complicated  5. Anxiety - Increased Zoloft with psychiatrist on Monday of this week   Preterm labor symptoms and general obstetric precautions including but not limited to vaginal bleeding, contractions, leaking of fluid and fetal movement were reviewed in detail with the patient. Please refer to After Visit Summary for other counseling recommendations.   Return in about 2 weeks (around 09/02/2019) [facility].  Future Appointments  Date Time Provider [place]4/19/2021 11:30 AM [place]FC[place]  08/22/2019 11:30 AM WH-MFC [place] WH-MFCUS MFC-US  09/12/2019  1:30 PM Kozlow, ErDonnamarie PoagMD [facility]-[place] None  09/26/2019  1:15 PM [place]  JuKerry HoughPA[place]

## 2019-08-19 NOTE — Patient Instructions (Addendum)
Fetal Movement Counts Patient Name: ________________________________________________ Patient Due Date: ____________________ What is a fetal movement count?  A fetal movement count is the number of times that you feel your baby move during a certain amount of time. This may also be called a fetal kick count. A fetal movement count is recommended for every pregnant woman. You may be asked to start counting fetal movements as early as week 28 of your pregnancy. Pay attention to when your baby is most active. You may notice your baby's sleep and wake cycles. You may also notice things that make your baby move more. You should do a fetal movement count:  When your baby is normally most active.  At the same time each day. A good time to count movements is while you are resting, after having something to eat and drink. How do I count fetal movements? 1. Find a quiet, comfortable area. Sit, or lie down on your side. 2. Write down the date, the start time and stop time, and the number of movements that you felt between those two times. Take this information with you to your health care visits. 3. Write down your start time when you feel the first movement. 4. Count kicks, flutters, swishes, rolls, and jabs. You should feel at least 10 movements. 5. You may stop counting after you have felt 10 movements, or if you have been counting for 2 hours. Write down the stop time. 6. If you do not feel 10 movements in 2 hours, contact your health care provider for further instructions. Your health care provider may want to do additional tests to assess your baby's well-being. Contact a health care provider if:  You feel fewer than 10 movements in 2 hours.  Your baby is not moving like he or she usually does. Date: ____________ Start time: ____________ Stop time: ____________ Movements: ____________ Date: ____________ Start time: ____________ Stop time: ____________ Movements: ____________ Date: ____________  Start time: ____________ Stop time: ____________ Movements: ____________ Date: ____________ Start time: ____________ Stop time: ____________ Movements: ____________ Date: ____________ Start time: ____________ Stop time: ____________ Movements: ____________ Date: ____________ Start time: ____________ Stop time: ____________ Movements: ____________ Date: ____________ Start time: ____________ Stop time: ____________ Movements: ____________ Date: ____________ Start time: ____________ Stop time: ____________ Movements: ____________ Date: ____________ Start time: ____________ Stop time: ____________ Movements: ____________ This information is not intended to replace advice given to you by your health care provider. Make sure you discuss any questions you have with your health care provider. Document Revised: 12/09/2018 Document Reviewed: 12/09/2018 Elsevier Patient Education  2020 Else[employer] Inc. Braxton Hicks Contractions Contractions of the uterus can occur throughout pregnancy, but they are not always a sign that you are in labor. You may have practice contractions called Braxton Hicks contractions. These false labor contractions are sometimes confused with true labor. What are Braxton Hicks contractions? Braxton Hicks contractions are tightening movements that occur in the muscles of the uterus before labor. Unlike true labor contractions, these contractions do not result in opening (dilation) and thinning of the cervix. Toward the end of pregnancy (32-34 weeks), Braxton Hicks contractions can happen more often and may become stronger. These contractions are sometimes difficult to tell apart from true labor because they can be very uncomfortable. You should not feel embarrassed if you go to the hospital with false labor. Sometimes, the only way to tell if you are in true labor is for your health care provider to look for changes in the cervix. The health care provider   will do a physical exam and may  monitor your contractions. If you are not in true labor, the exam should show that your cervix is not dilating and your water has not broken. If there are no other health problems associated with your pregnancy, it is completely safe for you to be sent home with false labor. You may continue to have Braxton Hicks contractions until you go into true labor. How to tell the difference between true labor and false labor True labor  Contractions last 30-70 seconds.  Contractions become very regular.  Discomfort is usually felt in the top of the uterus, and it spreads to the lower abdomen and low back.  Contractions do not go away with walking.  Contractions usually become more intense and increase in frequency.  The cervix dilates and gets thinner. False labor  Contractions are usually shorter and not as strong as true labor contractions.  Contractions are usually irregular.  Contractions are often felt in the front of the lower abdomen and in the groin.  Contractions may go away when you walk around or change positions while lying down.  Contractions get weaker and are shorter-lasting as time goes on.  The cervix usually does not dilate or become thin. Follow these instructions at home:   Take over-the-counter and prescription medicines only as told by your health care provider.  Keep up with your usual exercises and follow other instructions from your health care provider.  Eat and drink lightly if you think you are going into labor.  If Braxton Hicks contractions are making you uncomfortable: ? Change your position from lying down or resting to walking, or change from walking to resting. ? Sit and rest in a tub of warm water. ? Drink enough fluid to keep your urine pale yellow. Dehydration may cause these contractions. ? Do slow and deep breathing several times an hour.  Keep all follow-up prenatal visits as told by your health care provider. This is important. Contact a  health care provider if:  You have a fever.  You have continuous pain in your abdomen. Get help right away if:  Your contractions become stronger, more regular, and closer together.  You have fluid leaking or gushing from your vagina.  You pass blood-tinged mucus (bloody show).  You have bleeding from your vagina.  You have low back pain that you never had before.  You feel your baby's head pushing down and causing pelvic pressure.  Your baby is not moving inside you as much as it used to. Summary  Contractions that occur before labor are called Braxton Hicks contractions, false labor, or practice contractions.  Braxton Hicks contractions are usually shorter, weaker, farther apart, and less regular than true labor contractions. True labor contractions usually become progressively stronger and regular, and they become more frequent.  Manage discomfort from Braxton Hicks contractions by changing position, resting in a warm bath, drinking plenty of water, or practicing deep breathing. This information is not intended to replace advice given to you by your health care provider. Make sure you discuss any questions you have with your health care provider. Document Revised: 04/03/2017 Document Reviewed: 09/04/2016 Elsevier Patient Education  2020 Elsevier Inc.  

## 2019-08-22 ENCOUNTER — Other Ambulatory Visit (HOSPITAL_COMMUNITY): Payer: Self-pay | Admitting: *Deleted

## 2019-08-22 ENCOUNTER — Ambulatory Visit (HOSPITAL_COMMUNITY)
Admission: RE | Admit: 2019-08-22 | Discharge: 2019-08-22 | Disposition: A | Discharge status: Home / Self Care | Payer: Medicaid Other | Source: Ambulatory Visit | Attending: Obstetrics and Gynecology | Admitting: Obstetrics and Gynecology

## 2019-08-22 ENCOUNTER — Encounter (HOSPITAL_COMMUNITY): Payer: Self-pay

## 2019-08-22 ENCOUNTER — Other Ambulatory Visit: Payer: Self-pay

## 2019-08-22 ENCOUNTER — Ambulatory Visit (HOSPITAL_COMMUNITY): Payer: Medicaid Other | Admitting: *Deleted

## 2019-08-22 DIAGNOSIS — O365930 Maternal care for other known or suspected poor fetal growth, third trimester, not applicable or unspecified: Secondary | ICD-10-CM

## 2019-08-22 DIAGNOSIS — O99213 Obesity complicating pregnancy, third trimester: Secondary | ICD-10-CM

## 2019-08-22 DIAGNOSIS — E669 Obesity, unspecified: Secondary | ICD-10-CM

## 2019-08-22 DIAGNOSIS — F419 Anxiety disorder, unspecified: Secondary | ICD-10-CM

## 2019-08-22 DIAGNOSIS — Z362 Encounter for other antenatal screening follow-up: Secondary | ICD-10-CM

## 2019-08-22 DIAGNOSIS — O99343 Other mental disorders complicating pregnancy, third trimester: Secondary | ICD-10-CM | POA: Diagnosis not present

## 2019-08-22 DIAGNOSIS — F09 Unspecified mental disorder due to known physiological condition: Secondary | ICD-10-CM

## 2019-08-22 DIAGNOSIS — Z3A31 31 weeks gestation of pregnancy: Secondary | ICD-10-CM

## 2019-08-22 DIAGNOSIS — O99210 Obesity complicating pregnancy, unspecified trimester: Secondary | ICD-10-CM | POA: Insufficient documentation

## 2019-09-05 ENCOUNTER — Encounter: Payer: Medicaid Other | Admitting: Obstetrics and Gynecology

## 2019-09-08 ENCOUNTER — Ambulatory Visit (INDEPENDENT_AMBULATORY_CARE_PROVIDER_SITE_OTHER): Payer: Medicaid Other | Admitting: Obstetrics and Gynecology

## 2019-09-08 ENCOUNTER — Other Ambulatory Visit: Payer: Self-pay

## 2019-09-08 VITALS — BP 130/76 | HR 116 | Wt 164.4 lb

## 2019-09-08 DIAGNOSIS — O99210 Obesity complicating pregnancy, unspecified trimester: Secondary | ICD-10-CM

## 2019-09-08 DIAGNOSIS — Z6830 Body mass index (BMI) 30.0-30.9, adult: Secondary | ICD-10-CM

## 2019-09-08 DIAGNOSIS — O219 Vomiting of pregnancy, unspecified: Secondary | ICD-10-CM | POA: Diagnosis not present

## 2019-09-08 DIAGNOSIS — O99213 Obesity complicating pregnancy, third trimester: Secondary | ICD-10-CM

## 2019-09-08 DIAGNOSIS — E669 Obesity, unspecified: Secondary | ICD-10-CM

## 2019-09-08 DIAGNOSIS — J45909 Unspecified asthma, uncomplicated: Secondary | ICD-10-CM | POA: Diagnosis not present

## 2019-09-08 DIAGNOSIS — Z3493 Encounter for supervision of normal pregnancy, unspecified, third trimester: Secondary | ICD-10-CM

## 2019-09-08 DIAGNOSIS — O99513 Diseases of the respiratory system complicating pregnancy, third trimester: Secondary | ICD-10-CM | POA: Diagnosis not present

## 2019-09-08 LAB — GLUCOSE, CAPILLARY: Glucose-Capillary: 87 mg/dL (ref 70–99)

## 2019-09-08 MED ORDER — FAMOTIDINE 20 MG PO TABS: 20.0000 mg | ORAL_TABLET | Freq: Two times a day (BID) | ORAL | 3 refills | Status: DC

## 2019-09-08 NOTE — Progress Notes (Signed)
Prenatal Visit Note Date: 09/08/2019 Clinic: [facility]  Subjective:  Tracy [name] is a 24 y.o. G1P0 at [redacted]w[redacted]d being seen today for ongoing prenatal care.  She is currently monitored for the following issues for this high-risk pregnancy and has Nausea and vomiting during pregnancy; Moderate persistent asthma; Supervision of low-risk pregnancy; Obesity in pregnancy, antepartum; Depression; Anxiety; Asthma; Tetrahydrocannabinol (THC) use disorder, mild, abuse; Rubella non-immune status, antepartum; and BMI 30.0-30.9,adult on their problem list.  Patient reports no complaints.   Contractions: Not present. Vag. Bleeding: None.  Movement: Present. Denies leaking of fluid.   The following portions of the patient's history were reviewed and updated as appropriate: allergies, current medications, past family history, past medical history, past social history, past surgical history and problem list. Problem list updated.  Objective:   Vitals:   09/08/19 1324  BP: 130/76  Pulse: (!) 116  Weight: 164 lb 6.4 oz (74.6 kg)    Fetal Status: Fetal Heart Rate (bpm): 132   Movement: Present     General:  Alert, oriented and cooperative. Patient is in no acute distress.  Skin: Skin is warm and dry. No rash noted.   Cardiovascular: Normal heart rate noted  Respiratory: Normal respiratory effort, no problems with respiration noted  Abdomen: Soft, gravid, appropriate for gestational age. Pain/Pressure: Absent     Pelvic:  Cervical exam deferred        Extremities: Normal range of motion.  Edema: None  Mental Status: Normal mood and affect. Normal behavior. Normal judgment and thought content.   Urinalysis:      Assessment and Plan:  Pregnancy: G1P0 at [redacted]w[redacted]d  1. Encounter for supervision of low-risk pregnancy in third trimester Routine care. Ask about BC more nv. GBS nv Borderline FGR last u/s. Has rpt on 5/18 Random CGG 87 today Check one more nv  2. Obesity in  pregnancy, antepartum  3. BMI 30.0-30.9,adult  4. Asthma, unspecified asthma severity, unspecified whether complicated, unspecified whether persistent No issues on no meds  5. N/V of pregnancy On zofran PRN only. Even though she has no gerd s/s, I recommend going on something. pepcid sent in. Weight down a little from last visit. Continue to follow  6 Psych Continue Zoloft  Preterm labor symptoms and general obstetric precautions including but not limited to vaginal bleeding, contractions, leaking of fluid and fetal movement were reviewed in detail with the patient. Please refer to After Visit Summary for other counseling recommendations.  Return in about 12 days (around 09/20/2019) for low risk ob in person.   Otway Bing, MD

## 2019-09-12 ENCOUNTER — Other Ambulatory Visit: Payer: Self-pay

## 2019-09-12 ENCOUNTER — Ambulatory Visit (INDEPENDENT_AMBULATORY_CARE_PROVIDER_SITE_OTHER): Payer: Medicaid Other | Admitting: Allergy and Immunology

## 2019-09-12 ENCOUNTER — Encounter: Payer: Self-pay | Admitting: Allergy and Immunology

## 2019-09-12 VITALS — BP 126/68 | HR 110 | Temp 98.7°F | Resp 18

## 2019-09-12 DIAGNOSIS — J4540 Moderate persistent asthma, uncomplicated: Secondary | ICD-10-CM

## 2019-09-12 DIAGNOSIS — Z3A23 23 weeks gestation of pregnancy: Secondary | ICD-10-CM

## 2019-09-12 DIAGNOSIS — J3089 Other allergic rhinitis: Secondary | ICD-10-CM | POA: Diagnosis not present

## 2019-09-12 MED ORDER — ALBUTEROL SULFATE HFA 108 (90 BASE) MCG/ACT IN AERS: INHALATION_SPRAY | RESPIRATORY_TRACT | 1 refills | Status: DC

## 2019-09-12 MED ORDER — LORATADINE 10 MG PO TABS: ORAL_TABLET | ORAL | 5 refills | Status: DC

## 2019-09-12 NOTE — Patient Instructions (Addendum)
  1.  Continue Flovent 110 - 2 inhalations 2 times per day  2.  If needed:   A. ProAir HFA 2 inhalations or nebulization every 4-6 hours  B.  OTC antihistamine - loratadine 10 mg -1 tablet 1 time per day  3. Obtain Covid vaccine after delivery  4.  Return to clinic in 12 weeks or earlier if problem

## 2019-09-12 NOTE — Progress Notes (Signed)
[place] [facility] [place]   Follow-up Note  Referring Provider: No ref. provider found Primary Provider: Patient, No Pcp Per Date of Office Visit: 09/12/2019  Subjective:   JENINE KRISHER (DOB: September 01, 1995) is a 24 y.o. female who returns to the [facility] on 09/12/2019 in re-evaluation of the following:  HPI: Giah presents to this clinic in evaluation of asthma and allergic rhinitis.  I last saw her in this clinic on 22 June 2019 at which point in time she was [redacted] weeks pregnant.  She continues to do okay with her asthma.  She is basically having the same pattern of albuterol use which averages out to 2 times per day usually in the evening.  She has noticed that she has some positional issues with breathing at this point based upon her increased abdominal girth with her current 35-week pregnancy.  She has not had the need for a systemic steroid or antibiotic to treat any type of airway issue.  Her nose has been doing well and she no longer uses any montelukast.  Allergies as of 09/12/2019   No Known Allergies     Medication List      albuterol 108 (90 Base) MCG/ACT inhaler Commonly known as: ProAir HFA Can inhale two puffs every four to six hours as needed for cough or wheeze.   albuterol (2.5 MG/3ML) 0.083% nebulizer solution Commonly known as: PROVENTIL Take 2.5 mg by nebulization every 6 (six) hours as needed for wheezing or shortness of breath.   aspirin EC 81 MG tablet Take 1 tablet (81 mg total) by mouth daily. Take after 12 weeks for prevention of preeclampsia later in pregnancy. Start taking daily on 04/05/2019   Blood Pressure Kit Devi 1 Device by Does not apply route as needed.   diphenhydrAMINE 25 MG tablet Commonly known as: BENADRYL Take 25 mg by mouth at bedtime.   famotidine 20 MG tablet Commonly known as: PEPCID Take 1 tablet (20 mg total) by mouth 2 (two) times daily.   Flovent HFA 110 MCG/ACT  inhaler Generic drug: fluticasone Inhale two puffs twice daily to prevent cough or wheeze.  Rinse, gargle, and spit after use.   hydrOXYzine 10 MG tablet Commonly known as: ATARAX/VISTARIL Take 10 mg by mouth 3 (three) times daily as needed.   ketoconazole 2 % shampoo Commonly known as: NIZORAL U SHAMPOO QOD UTD   Magnesium 200 MG Tabs Take 1 tablet (200 mg total) by mouth at bedtime.   ondansetron 4 MG disintegrating tablet Commonly known as: Zofran ODT Take 1-2 tablets (4-8 mg total) by mouth every 6 (six) hours as needed for nausea or vomiting.   PRENATAL VITAMIN PO Take 1 tablet by mouth daily.   promethazine 25 MG tablet Commonly known as: PHENERGAN Take 25 mg by mouth every 6 (six) hours as needed for nausea or vomiting. Taking 1/2 tab usually   sertraline 50 MG tablet Commonly known as: ZOLOFT Take 3 tablets (150 mg total) by mouth daily.       Past Medical History:  Diagnosis Date  . Abdominal pain   . Anxiety   . Asthma   . Complication of anesthesia    panic attack   . Depression     Past Surgical History:  Procedure Laterality Date  . ADENOIDECTOMY    . CHOLECYSTECTOMY  05/2016  . ESOPHAGOGASTRODUODENOSCOPY N/A 08/12/2013   Procedure: ESOPHAGOGASTRODUODENOSCOPY (EGD);  Surgeon: Oletha Blend, MD;  Location: [facility];  Service: Endoscopy;  Laterality: N/A;  . TYMPANOSTOMY TUBE PLACEMENT      Review of systems negative except as noted in HPI / PMHx or noted below:  Review of Systems  Constitutional: Negative.   HENT: Negative.   Eyes: Negative.   Respiratory: Negative.   Cardiovascular: Negative.   Gastrointestinal: Negative.   Genitourinary: Negative.   Musculoskeletal: Negative.   Skin: Negative.   Neurological: Negative.   Endo/Heme/Allergies: Negative.   Psychiatric/Behavioral: Negative.      Objective:   Vitals:   09/12/19 1638  BP: 126/68  Pulse: (!) 110  Resp: 18  Temp: 98.7 F (37.1 C)  SpO2: 96%           Physical Exam Constitutional:      Appearance: She is not diaphoretic.  HENT:     Head: Normocephalic.     Right Ear: Tympanic membrane, ear canal and external ear normal.     Left Ear: Tympanic membrane, ear canal and external ear normal.     Nose: Nose normal. No mucosal edema or rhinorrhea.     Mouth/Throat:     Pharynx: Uvula midline. No oropharyngeal exudate.  Eyes:     Conjunctiva/sclera: Conjunctivae normal.  Neck:     Thyroid: No thyromegaly.     Trachea: Trachea normal. No tracheal tenderness or tracheal deviation.  Cardiovascular:     Rate and Rhythm: Normal rate and regular rhythm.     Heart sounds: Normal heart sounds, S1 normal and S2 normal. No murmur.  Pulmonary:     Effort: No respiratory distress.     Breath sounds: Normal breath sounds. No stridor. No wheezing or rales.  Lymphadenopathy:     Head:     Right side of head: No tonsillar adenopathy.     Left side of head: No tonsillar adenopathy.     Cervical: No cervical adenopathy.  Skin:    Findings: No erythema or rash.     Nails: There is no clubbing.  Neurological:     Mental Status: She is alert.     Diagnostics:    Spirometry was performed and demonstrated an FEV1 of 2.15 at 70 % of predicted.  Assessment and Plan:   1. Not well controlled moderate persistent asthma   2. Perennial allergic rhinitis   3. [redacted] weeks gestation of pregnancy     1.  Continue Flovent 110 - 2 inhalations 2 times per day  2.  If needed:   A. ProAir HFA 2 inhalations or nebulization every 4-6 hours  B.  OTC antihistamine - loratadine 10 mg -1 tablet 1 time per day  3. Obtain Covid vaccine after delivery  4.  Return to clinic in 12 weeks or earlier if problem  Meloni appears to be stable on her current dose of inhaled steroid regarding control of her asthma and her nose does not really appear to be much of her problem requiring any type of therapy.  She has had some side effects with other medications administered  in the past and control of her asthma and she has basically been on a trajectory of stable asthma pattern now for months and were not going to change any of her therapy and allow her to deliver her baby and after that period in time we will see what happens to her asthma activity and make a decision about changing her therapy based upon her status when she returns to this clinic in 12 weeks or earlier if there is a problem.  Allena Katz, MD Allergy / Immunology [place]

## 2019-09-16 ENCOUNTER — Encounter: Payer: Self-pay | Admitting: Allergy and Immunology

## 2019-09-20 ENCOUNTER — Other Ambulatory Visit (HOSPITAL_COMMUNITY)
Admission: RE | Admit: 2019-09-20 | Discharge: 2019-09-20 | Disposition: A | Discharge status: Home / Self Care | Payer: Medicaid Other | Source: Ambulatory Visit | Attending: Student | Admitting: Student

## 2019-09-20 ENCOUNTER — Other Ambulatory Visit: Payer: Self-pay | Admitting: *Deleted

## 2019-09-20 ENCOUNTER — Ambulatory Visit (INDEPENDENT_AMBULATORY_CARE_PROVIDER_SITE_OTHER): Payer: Medicaid Other | Admitting: Student

## 2019-09-20 ENCOUNTER — Other Ambulatory Visit: Payer: Self-pay

## 2019-09-20 ENCOUNTER — Ambulatory Visit (HOSPITAL_COMMUNITY): Payer: Medicaid Other | Attending: Obstetrics and Gynecology

## 2019-09-20 ENCOUNTER — Other Ambulatory Visit (HOSPITAL_COMMUNITY): Payer: Self-pay | Admitting: Obstetrics

## 2019-09-20 ENCOUNTER — Ambulatory Visit: Payer: Medicaid Other | Admitting: *Deleted

## 2019-09-20 VITALS — BP 120/79 | HR 99 | Wt 167.0 lb

## 2019-09-20 DIAGNOSIS — O99213 Obesity complicating pregnancy, third trimester: Secondary | ICD-10-CM | POA: Diagnosis not present

## 2019-09-20 DIAGNOSIS — O99210 Obesity complicating pregnancy, unspecified trimester: Secondary | ICD-10-CM

## 2019-09-20 DIAGNOSIS — Z3A36 36 weeks gestation of pregnancy: Secondary | ICD-10-CM

## 2019-09-20 DIAGNOSIS — O99343 Other mental disorders complicating pregnancy, third trimester: Secondary | ICD-10-CM

## 2019-09-20 DIAGNOSIS — Z3493 Encounter for supervision of normal pregnancy, unspecified, third trimester: Secondary | ICD-10-CM

## 2019-09-20 DIAGNOSIS — O365930 Maternal care for other known or suspected poor fetal growth, third trimester, not applicable or unspecified: Secondary | ICD-10-CM

## 2019-09-20 DIAGNOSIS — Z362 Encounter for other antenatal screening follow-up: Secondary | ICD-10-CM

## 2019-09-20 DIAGNOSIS — E668 Other obesity: Secondary | ICD-10-CM

## 2019-09-20 DIAGNOSIS — F419 Anxiety disorder, unspecified: Secondary | ICD-10-CM | POA: Insufficient documentation

## 2019-09-20 LAB — GLUCOSE, CAPILLARY: Glucose-Capillary: 86 mg/dL (ref 70–99)

## 2019-09-20 NOTE — Progress Notes (Signed)
Patient ID: Tracy [name], female   DOB: 1996/04/30, 24 y.o.   MRN: [id number]   PRENATAL VISIT NOTE  Subjective:  Tracy [name] is a 24 y.o. G1P0 at [redacted]w[redacted]d being seen today for ongoing prenatal care.  She is currently monitored for the following issues for this high-risk pregnancy and has Nausea and vomiting during pregnancy; Moderate persistent asthma; Supervision of low-risk pregnancy; Obesity in pregnancy, antepartum; Depression; Anxiety; Asthma; Tetrahydrocannabinol (THC) use disorder, mild, abuse; Rubella non-immune status, antepartum; and BMI 30.0-30.9,adult on their problem list.  Patient reports no complaints. Patient has many questions about pain relief; she does not want any opiates in pregnancy. She is unsure about epidural, would like nitrous.   Contractions: Not present. Vag. Bleeding: None.  Movement: Present. Denies leaking of fluid.   The following portions of the patient's history were reviewed and updated as appropriate: allergies, current medications, past family history, past medical history, past social history, past surgical history and problem list.   Objective:   Vitals:   09/20/19 1414  BP: 120/79  Pulse: 99  Weight: 167 lb (75.8 kg)    Fetal Status: Fetal Heart Rate (bpm): 133 Fundal Height: 36 cm Movement: Present     General:  Alert, oriented and cooperative. Patient is in no acute distress.  Skin: Skin is warm and dry. No rash noted.   Cardiovascular: Normal heart rate noted  Respiratory: Normal respiratory effort, no problems with respiration noted  Abdomen: Soft, gravid, appropriate for gestational age.  Pain/Pressure: Absent     Pelvic: Cervical exam performed in the presence of a chaperone        Extremities: Normal range of motion.  Edema: None  Mental Status: Normal mood and affect. Normal behavior. Normal judgment and thought content.   Assessment and Plan:  Pregnancy: G1P0 at [redacted]w[redacted]d 1. Encounter for supervision of low-risk pregnancy in third  trimester -Growth OK today per patientl; patient says "head was down" (report not available in Epic yet) -Fasting blood sugar on 03/24 was 95, random on 05/6 was 87, today FBS is 86 -Reviewed methods of pain relief in labor, all questions answered.  - Culture, beta strep (group b only) - GC/Chlamydia probe amp ([facility])not at [facility] - Patient declines to see Asher Muir today; she reports that she "feels good over" and that her scores on the Depression scale are "good" for her. She agrees to see Asher Muir next week.  Preterm labor symptoms and general obstetric precautions including but not limited to vaginal bleeding, contractions, leaking of fluid and fetal movement were reviewed in detail with the patient. Please refer to After Visit Summary for other counseling recommendations.   Return in about 1 week (around 09/27/2019), or HROB.  Future Appointments  Date Time Provider Department Center  09/26/2019  1:15 PM [facility] HEALTH CLINICIAN [facility]  09/28/2019 10:15 AM Venora Maples, MD [facility]  09/28/2019  2:45 PM [facility] US4 [facility]  10/04/2019  3:15 PM Joselyn Arrow, MD [facility]  10/05/2019  3:15 PM [facility] US2 [facility]  10/11/2019  3:55 PM Venora Maples, MD [facility]  10/12/2019 11:30 AM [facility] US3 [facility]  12/05/2019  4:30 PM Kozlow, Alvira Philips, MD [facility] None    Marylene Land, CNM

## 2019-09-20 NOTE — Progress Notes (Signed)
Patient has elevated phq 9 today- declines to see Asher Muir at this time

## 2019-09-21 LAB — GC/CHLAMYDIA PROBE AMP (~~LOC~~) NOT AT ARMC
Chlamydia: NEGATIVE
Comment: NEGATIVE
Comment: NORMAL
Neisseria Gonorrhea: NEGATIVE

## 2019-09-22 NOTE — BH Assessment (Addendum)
error 

## 2019-09-24 LAB — CULTURE, BETA STREP (GROUP B ONLY): Strep Gp B Culture: NEGATIVE

## 2019-09-26 ENCOUNTER — Ambulatory Visit: Payer: Medicaid Other | Admitting: Clinical

## 2019-09-26 ENCOUNTER — Other Ambulatory Visit: Payer: Self-pay

## 2019-09-26 DIAGNOSIS — Z91199 Patient's noncompliance with other medical treatment and regimen due to unspecified reason: Secondary | ICD-10-CM

## 2019-09-26 NOTE — BH Specialist Note (Signed)
Pt did not arrive to video visit and did not answer the phone ; Left HIPPA-compliant message to call back Tracy [name] from [facility] [employer] [facility] at [phone] (main office) or [phone] (Kierston Plasencia's office).  ; left MyChart message for patient.    Integrated Behavioral Health via Telemedicine Video Visit  09/26/2019 [name] [phone]  Tracy [name]

## 2019-09-28 ENCOUNTER — Ambulatory Visit: Payer: Medicaid Other | Admitting: *Deleted

## 2019-09-28 ENCOUNTER — Other Ambulatory Visit: Payer: Self-pay

## 2019-09-28 ENCOUNTER — Ambulatory Visit (INDEPENDENT_AMBULATORY_CARE_PROVIDER_SITE_OTHER): Payer: Medicaid Other | Admitting: Family Medicine

## 2019-09-28 ENCOUNTER — Other Ambulatory Visit: Payer: Self-pay | Admitting: Maternal & Fetal Medicine

## 2019-09-28 ENCOUNTER — Ambulatory Visit: Payer: Medicaid Other | Attending: Maternal & Fetal Medicine

## 2019-09-28 VITALS — BP 120/88 | HR 86 | Wt 167.0 lb

## 2019-09-28 DIAGNOSIS — O365990 Maternal care for other known or suspected poor fetal growth, unspecified trimester, not applicable or unspecified: Secondary | ICD-10-CM | POA: Insufficient documentation

## 2019-09-28 DIAGNOSIS — O365930 Maternal care for other known or suspected poor fetal growth, third trimester, not applicable or unspecified: Secondary | ICD-10-CM

## 2019-09-28 DIAGNOSIS — O09899 Supervision of other high risk pregnancies, unspecified trimester: Secondary | ICD-10-CM

## 2019-09-28 DIAGNOSIS — Z3493 Encounter for supervision of normal pregnancy, unspecified, third trimester: Secondary | ICD-10-CM | POA: Insufficient documentation

## 2019-09-28 DIAGNOSIS — O99513 Diseases of the respiratory system complicating pregnancy, third trimester: Secondary | ICD-10-CM

## 2019-09-28 DIAGNOSIS — O99323 Drug use complicating pregnancy, third trimester: Secondary | ICD-10-CM

## 2019-09-28 DIAGNOSIS — F419 Anxiety disorder, unspecified: Secondary | ICD-10-CM

## 2019-09-28 DIAGNOSIS — O99891 Other specified diseases and conditions complicating pregnancy: Secondary | ICD-10-CM

## 2019-09-28 DIAGNOSIS — O99343 Other mental disorders complicating pregnancy, third trimester: Secondary | ICD-10-CM

## 2019-09-28 DIAGNOSIS — J45909 Unspecified asthma, uncomplicated: Secondary | ICD-10-CM

## 2019-09-28 DIAGNOSIS — O99210 Obesity complicating pregnancy, unspecified trimester: Secondary | ICD-10-CM | POA: Insufficient documentation

## 2019-09-28 DIAGNOSIS — E669 Obesity, unspecified: Secondary | ICD-10-CM

## 2019-09-28 DIAGNOSIS — O99213 Obesity complicating pregnancy, third trimester: Secondary | ICD-10-CM

## 2019-09-28 DIAGNOSIS — F1290 Cannabis use, unspecified, uncomplicated: Secondary | ICD-10-CM

## 2019-09-28 DIAGNOSIS — Z3A37 37 weeks gestation of pregnancy: Secondary | ICD-10-CM

## 2019-09-28 DIAGNOSIS — O403XX0 Polyhydramnios, third trimester, not applicable or unspecified: Secondary | ICD-10-CM

## 2019-09-28 DIAGNOSIS — Z283 Underimmunization status: Secondary | ICD-10-CM

## 2019-09-28 DIAGNOSIS — E668 Other obesity: Secondary | ICD-10-CM

## 2019-09-28 LAB — GLUCOSE, CAPILLARY: Glucose-Capillary: 77 mg/dL (ref 70–99)

## 2019-09-28 NOTE — Progress Notes (Signed)
   Subjective:  Tracy [name] is a 24 y.o. G1P0 at 71w2dbeing seen today for ongoing prenatal care.  She is currently monitored for the following issues for this high-risk pregnancy and has Nausea and vomiting during pregnancy; Moderate persistent asthma; Supervision of low-risk pregnancy; Obesity in pregnancy, antepartum; Depression; Anxiety; Asthma; Tetrahydrocannabinol (THC) use disorder, mild, abuse; Rubella non-immune status, antepartum; BMI 30.0-30.9,adult; and IUGR on their problem list.  Patient reports no complaints.  Contractions: Not present. Vag. Bleeding: None.  Movement: Present. Denies leaking of fluid.   The following portions of the patient's history were reviewed and updated as appropriate: allergies, current medications, past family history, past medical history, past social history, past surgical history and problem list. Problem list updated.  Objective:   Vitals:   09/28/19 1045  BP: 120/88  Pulse: 86  Weight: 167 lb (75.8 kg)    Fetal Status: Fetal Heart Rate (bpm): 125   Movement: Present     General:  Alert, oriented and cooperative. Patient is in no acute distress.  Skin: Skin is warm and dry. No rash noted.   Cardiovascular: Normal heart rate noted  Respiratory: Normal respiratory effort, no problems with respiration noted  Abdomen: Soft, gravid, appropriate for gestational age. Pain/Pressure: Present     Pelvic: Vag. Bleeding: None     Cervical exam deferred        Extremities: Normal range of motion.  Edema: None  Mental Status: Normal mood and affect. Normal behavior. Normal judgment and thought content.   Urinalysis:      Assessment and Plan:  Pregnancy: G1P0 at 375w2d1. Encounter for supervision of low-risk pregnancy in third trimester Stable Reviewed neg GBS/GN/CT swabs from last visit Notably did not complete 2hr GTT at appropriate time, early test was normal however declined to complete 2hr at 28wk visit Reported eating ~1.5hr prior to  arrival, CBG 77 Discussed IUGR in detail and rationale for IOL and reduction in risk of stillbirth Concerned about prematurity Engaged in shared decision making, balancing risks of IUGR with prematurity and agrees to 39wk IOL Note created and routed, admission orders placed, plan for FB/pit in setting of IUGR  2. Poor fetal growth affecting management of mother in third trimester, single or unspecified fetus 6% on [place]/18/2021 with normal AFI and dopplers Engaged in weekly antenatal testing IOL 39wks per above discussion  3. Rubella non-immune status, antepartum MMR PP  4. Obesity in pregnancy, antepartum   5. Asthma, unspecified asthma severity, unspecified whether complicated, unspecified whether persistent   Term labor symptoms and general obstetric precautions including but not limited to vaginal bleeding, contractions, leaking of fluid and fetal movement were reviewed in detail with the patient. Please refer to After Visit Summary for other counseling recommendations.  Return in about 1 week (around 10/05/2019).   EcClarnce FlockMD

## 2019-09-29 NOTE — Progress Notes (Signed)
Induction Assessment Scheduling Form: Fax to [facility]:  [phone] Route to [facility] Labor Delivery   Tracy [name]                                                                                   DOB:  06-15-1995                                                            MRN:  [id number]  Phone:  Home Phone [phone]  Mobile [phone]    Provider:  [facility] (Faculty Practice)  Admission Date/Time:  10/11/2019 GP:  G1P0     Gestational age on admission:  [redacted]w[redacted]d                                                Estimated Date of Delivery: 10/18/19  Dating Criteria: LMP c/w 21wk [place]  Filed Weights   09/28/19 1045  Weight: 167 lb (75.8 kg)    GBS: Negative/-- (05/18 1528) HIV:  Non Reactive (03/24 1032)  Reason for induction:  IUGR Scheduling Provider Signature:  Venora Maples, MD         Method of induction(proposed):  Foley bulb and pit   Scheduling Provider Signature:  Venora Maples, MD                                            Today's Date:  09/29/2019

## 2019-09-30 ENCOUNTER — Telehealth (HOSPITAL_COMMUNITY): Payer: Self-pay | Admitting: *Deleted

## 2019-09-30 NOTE — Telephone Encounter (Signed)
Preadmission screen  

## 2019-10-04 ENCOUNTER — Ambulatory Visit (INDEPENDENT_AMBULATORY_CARE_PROVIDER_SITE_OTHER): Payer: Medicaid Other | Admitting: Family Medicine

## 2019-10-04 ENCOUNTER — Other Ambulatory Visit: Payer: Self-pay

## 2019-10-04 VITALS — BP 128/88 | HR 97 | Wt 166.9 lb

## 2019-10-04 DIAGNOSIS — O99513 Diseases of the respiratory system complicating pregnancy, third trimester: Secondary | ICD-10-CM

## 2019-10-04 DIAGNOSIS — O365930 Maternal care for other known or suspected poor fetal growth, third trimester, not applicable or unspecified: Secondary | ICD-10-CM

## 2019-10-04 DIAGNOSIS — O09619 Supervision of young primigravida, unspecified trimester: Secondary | ICD-10-CM

## 2019-10-04 DIAGNOSIS — O99210 Obesity complicating pregnancy, unspecified trimester: Secondary | ICD-10-CM

## 2019-10-04 DIAGNOSIS — O99891 Other specified diseases and conditions complicating pregnancy: Secondary | ICD-10-CM

## 2019-10-04 DIAGNOSIS — F4323 Adjustment disorder with mixed anxiety and depressed mood: Secondary | ICD-10-CM

## 2019-10-04 DIAGNOSIS — E669 Obesity, unspecified: Secondary | ICD-10-CM

## 2019-10-04 DIAGNOSIS — Z283 Underimmunization status: Secondary | ICD-10-CM

## 2019-10-04 DIAGNOSIS — O99343 Other mental disorders complicating pregnancy, third trimester: Secondary | ICD-10-CM

## 2019-10-04 DIAGNOSIS — Z2839 Other underimmunization status: Secondary | ICD-10-CM

## 2019-10-04 DIAGNOSIS — Z3A38 38 weeks gestation of pregnancy: Secondary | ICD-10-CM

## 2019-10-04 DIAGNOSIS — J45909 Unspecified asthma, uncomplicated: Secondary | ICD-10-CM

## 2019-10-04 DIAGNOSIS — O09899 Supervision of other high risk pregnancies, unspecified trimester: Secondary | ICD-10-CM

## 2019-10-04 MED ORDER — FLOVENT HFA 220 MCG/ACT IN AERO: 2.0000 | INHALATION_SPRAY | Freq: Two times a day (BID) | RESPIRATORY_TRACT | 12 refills | Status: DC

## 2019-10-04 NOTE — Progress Notes (Addendum)
PRENATAL VISIT NOTE  Subjective:  Tracy [name] is a 24 y.o. G1P0 at 57w0dbeing seen today for ongoing prenatal care.  She is currently monitored for the following issues for this high-risk pregnancy and has Nausea and vomiting during pregnancy; Moderate persistent asthma; Supervision of low-risk pregnancy; Obesity in pregnancy, antepartum; Depression; Anxiety; Asthma; Tetrahydrocannabinol (THC) use disorder, mild, abuse; Rubella non-immune status, antepartum; BMI 30.0-30.9,adult; and IUGR on their problem list.  Patient reports feeling "hot all the time".  Contractions: Not present. Vag. Bleeding: None.  Movement: Present. Denies leaking of fluid.   The following portions of the patient's history were reviewed and updated as appropriate: allergies, current medications, past family history, past medical history, past social history, past surgical history and problem list.   Objective:   Vitals:   10/04/19 1529  BP: 128/88  Pulse: 97  Weight: 166 lb 14.4 oz (75.7 kg)    Fetal Status: Fetal Heart Rate (bpm): 135 Fundal Height: 39 cm Movement: Present  Presentation: Vertex  General:  Alert, oriented and cooperative. Patient is in no acute distress.  Skin: Skin is warm and dry. No rash noted.   Cardiovascular: Normal heart rate noted  Respiratory: Normal respiratory effort, no problems with respiration noted  Abdomen: Soft, gravid, appropriate for gestational age.  Pain/Pressure: Present     Pelvic: Cervical exam deferred        Extremities: Normal range of motion.  Edema: Trace  Mental Status: Normal mood and affect. Normal behavior. Normal judgment and thought content.   Assessment and Plan:  Pregnancy: G1P0 at 359w0dayton was seen today for routine prenatal visit.  Diagnoses and all orders for this visit:  Encounter for supervision of high-risk pregnancy of young primigravida Poor fetal growth affecting management of mother in third trimester, single or unspecified fetus      - IOL scheduled in 1 week       - GBS neg       - Boy-inpatient circ, breast, PP IUD       - BPP tomorrow. FGR @ 6% at 36w; measuring at 39w today on FH       - POC glucose 127, patient had eaten on her way to appt  Rubella non-immune status, antepartum       - Needs MMR post-partum  Obesity in pregnancy, antepartum  Asthma, unspecified asthma severity, unspecified whether complicated, unspecified whether persistent -     fluticasone (FLOVENT HFA) 220 MCG/ACT inhaler; Inhale 2 puffs into the lungs 2 (two) times daily. - Using albuterol daily despite Flovent 110; increased as above  Adjustment disorder with mixed anxiety and depressed mood       - Discussed mood and anxiety. Patient taking Zoloft 150 mg daily. Reports anxiety surrounding delivery. Discussed in detail. No SI.    Term labor symptoms and general obstetric precautions including but not limited to vaginal bleeding, contractions, leaking of fluid and fetal movement were reviewed in detail with the patient. Please refer to After Visit Summary for other counseling recommendations.   No follow-ups on file.  Future Appointments  Date Time Provider [place]6/06/2019  3:15 PM [facility]URSE [facility]  3:15 PM [facility]6/11/2019  9:40 AM MC-SCREENING [facility]SDSC None  10/11/2019  9:10 AM MC-LD SCHED ROOM [facility]INDC None  10/12/2019 11:30 AM [facility]/01/2020 11:30 AM [facility]/06/2019  4:30 PM Kozlow, ErDonnamarie PoagMD [facility]- None  Chauncey Mann, MD

## 2019-10-05 ENCOUNTER — Encounter (HOSPITAL_COMMUNITY): Payer: Self-pay | Admitting: *Deleted

## 2019-10-05 ENCOUNTER — Ambulatory Visit: Payer: Medicaid Other | Admitting: *Deleted

## 2019-10-05 ENCOUNTER — Ambulatory Visit: Payer: Medicaid Other | Attending: Maternal & Fetal Medicine

## 2019-10-05 ENCOUNTER — Telehealth (HOSPITAL_COMMUNITY): Payer: Self-pay | Admitting: *Deleted

## 2019-10-05 ENCOUNTER — Other Ambulatory Visit: Payer: Self-pay | Admitting: Maternal & Fetal Medicine

## 2019-10-05 DIAGNOSIS — O99213 Obesity complicating pregnancy, third trimester: Secondary | ICD-10-CM | POA: Diagnosis not present

## 2019-10-05 DIAGNOSIS — O99340 Other mental disorders complicating pregnancy, unspecified trimester: Secondary | ICD-10-CM | POA: Diagnosis not present

## 2019-10-05 DIAGNOSIS — F419 Anxiety disorder, unspecified: Secondary | ICD-10-CM | POA: Diagnosis present

## 2019-10-05 DIAGNOSIS — O99210 Obesity complicating pregnancy, unspecified trimester: Secondary | ICD-10-CM

## 2019-10-05 DIAGNOSIS — E668 Other obesity: Secondary | ICD-10-CM

## 2019-10-05 DIAGNOSIS — Z3A38 38 weeks gestation of pregnancy: Secondary | ICD-10-CM

## 2019-10-05 DIAGNOSIS — O403XX0 Polyhydramnios, third trimester, not applicable or unspecified: Secondary | ICD-10-CM | POA: Diagnosis not present

## 2019-10-05 DIAGNOSIS — O99323 Drug use complicating pregnancy, third trimester: Secondary | ICD-10-CM

## 2019-10-05 DIAGNOSIS — F1290 Cannabis use, unspecified, uncomplicated: Secondary | ICD-10-CM

## 2019-10-05 DIAGNOSIS — O365930 Maternal care for other known or suspected poor fetal growth, third trimester, not applicable or unspecified: Secondary | ICD-10-CM | POA: Insufficient documentation

## 2019-10-05 LAB — GLUCOSE, CAPILLARY: Glucose-Capillary: 127 mg/dL — ABNORMAL HIGH (ref 70–99)

## 2019-10-05 NOTE — Telephone Encounter (Signed)
Preadmission screen  

## 2019-10-08 ENCOUNTER — Encounter (HOSPITAL_COMMUNITY): Payer: Self-pay | Admitting: Anesthesiology

## 2019-10-08 ENCOUNTER — Inpatient Hospital Stay (HOSPITAL_COMMUNITY): Payer: Medicaid Other | Admitting: Anesthesiology

## 2019-10-08 ENCOUNTER — Inpatient Hospital Stay (HOSPITAL_COMMUNITY)
Admission: AD | Admit: 2019-10-08 | Discharge: 2019-10-10 | DRG: 806 | Disposition: A | Discharge status: Home / Self Care | Payer: Medicaid Other | Attending: Family Medicine | Admitting: Family Medicine

## 2019-10-08 ENCOUNTER — Other Ambulatory Visit: Payer: Self-pay

## 2019-10-08 ENCOUNTER — Encounter (HOSPITAL_COMMUNITY): Payer: Self-pay | Admitting: Family Medicine

## 2019-10-08 DIAGNOSIS — O365930 Maternal care for other known or suspected poor fetal growth, third trimester, not applicable or unspecified: Secondary | ICD-10-CM | POA: Diagnosis present

## 2019-10-08 DIAGNOSIS — O1414 Severe pre-eclampsia complicating childbirth: Secondary | ICD-10-CM | POA: Diagnosis present

## 2019-10-08 DIAGNOSIS — F1210 Cannabis abuse, uncomplicated: Secondary | ICD-10-CM | POA: Diagnosis present

## 2019-10-08 DIAGNOSIS — J4540 Moderate persistent asthma, uncomplicated: Secondary | ICD-10-CM | POA: Diagnosis present

## 2019-10-08 DIAGNOSIS — O403XX0 Polyhydramnios, third trimester, not applicable or unspecified: Secondary | ICD-10-CM | POA: Diagnosis present

## 2019-10-08 DIAGNOSIS — O99324 Drug use complicating childbirth: Secondary | ICD-10-CM | POA: Diagnosis present

## 2019-10-08 DIAGNOSIS — O365990 Maternal care for other known or suspected poor fetal growth, unspecified trimester, not applicable or unspecified: Secondary | ICD-10-CM | POA: Diagnosis present

## 2019-10-08 DIAGNOSIS — Z20822 Contact with and (suspected) exposure to covid-19: Secondary | ICD-10-CM | POA: Diagnosis present

## 2019-10-08 DIAGNOSIS — O99334 Smoking (tobacco) complicating childbirth: Secondary | ICD-10-CM | POA: Diagnosis present

## 2019-10-08 DIAGNOSIS — O99214 Obesity complicating childbirth: Secondary | ICD-10-CM | POA: Diagnosis present

## 2019-10-08 DIAGNOSIS — O99344 Other mental disorders complicating childbirth: Secondary | ICD-10-CM | POA: Diagnosis present

## 2019-10-08 DIAGNOSIS — O1410 Severe pre-eclampsia, unspecified trimester: Secondary | ICD-10-CM

## 2019-10-08 DIAGNOSIS — Z2839 Other underimmunization status: Secondary | ICD-10-CM

## 2019-10-08 DIAGNOSIS — F32A Depression, unspecified: Secondary | ICD-10-CM | POA: Diagnosis present

## 2019-10-08 DIAGNOSIS — O9952 Diseases of the respiratory system complicating childbirth: Secondary | ICD-10-CM | POA: Diagnosis present

## 2019-10-08 DIAGNOSIS — Z30014 Encounter for initial prescription of intrauterine contraceptive device: Secondary | ICD-10-CM | POA: Diagnosis not present

## 2019-10-08 DIAGNOSIS — J45909 Unspecified asthma, uncomplicated: Secondary | ICD-10-CM | POA: Diagnosis present

## 2019-10-08 DIAGNOSIS — E669 Obesity, unspecified: Secondary | ICD-10-CM | POA: Diagnosis present

## 2019-10-08 DIAGNOSIS — Z30430 Encounter for insertion of intrauterine contraceptive device: Secondary | ICD-10-CM

## 2019-10-08 DIAGNOSIS — Z3A38 38 weeks gestation of pregnancy: Secondary | ICD-10-CM

## 2019-10-08 DIAGNOSIS — Z3493 Encounter for supervision of normal pregnancy, unspecified, third trimester: Secondary | ICD-10-CM

## 2019-10-08 DIAGNOSIS — O4290 Premature rupture of membranes, unspecified as to length of time between rupture and onset of labor, unspecified weeks of gestation: Secondary | ICD-10-CM | POA: Diagnosis present

## 2019-10-08 DIAGNOSIS — F329 Major depressive disorder, single episode, unspecified: Secondary | ICD-10-CM | POA: Diagnosis present

## 2019-10-08 DIAGNOSIS — F419 Anxiety disorder, unspecified: Secondary | ICD-10-CM

## 2019-10-08 DIAGNOSIS — O99210 Obesity complicating pregnancy, unspecified trimester: Secondary | ICD-10-CM | POA: Diagnosis present

## 2019-10-08 DIAGNOSIS — O4202 Full-term premature rupture of membranes, onset of labor within 24 hours of rupture: Secondary | ICD-10-CM | POA: Diagnosis not present

## 2019-10-08 DIAGNOSIS — F17200 Nicotine dependence, unspecified, uncomplicated: Secondary | ICD-10-CM | POA: Diagnosis present

## 2019-10-08 DIAGNOSIS — O26893 Other specified pregnancy related conditions, third trimester: Secondary | ICD-10-CM | POA: Diagnosis present

## 2019-10-08 DIAGNOSIS — O4292 Full-term premature rupture of membranes, unspecified as to length of time between rupture and onset of labor: Secondary | ICD-10-CM | POA: Diagnosis present

## 2019-10-08 DIAGNOSIS — Z3490 Encounter for supervision of normal pregnancy, unspecified, unspecified trimester: Secondary | ICD-10-CM

## 2019-10-08 DIAGNOSIS — O1424 HELLP syndrome, complicating childbirth: Secondary | ICD-10-CM | POA: Diagnosis not present

## 2019-10-08 HISTORY — DX: Polyhydramnios, unspecified trimester, not applicable or unspecified: O40.9XX0

## 2019-10-08 HISTORY — DX: Maternal care for other known or suspected poor fetal growth, unspecified trimester, not applicable or unspecified: O36.5990

## 2019-10-08 LAB — COMPREHENSIVE METABOLIC PANEL
ALT: 167 U/L — ABNORMAL HIGH (ref 0–44)
ALT: 175 U/L — ABNORMAL HIGH (ref 0–44)
AST: 163 U/L — ABNORMAL HIGH (ref 15–41)
AST: 183 U/L — ABNORMAL HIGH (ref 15–41)
Albumin: 2.9 g/dL — ABNORMAL LOW (ref 3.5–5.0)
Albumin: 2.8 g/dL — ABNORMAL LOW (ref 3.5–5.0)
Alkaline Phosphatase: 199 U/L — ABNORMAL HIGH (ref 38–126)
Alkaline Phosphatase: 189 U/L — ABNORMAL HIGH (ref 38–126)
Anion gap: 15 (ref 5–15)
Anion gap: 11 (ref 5–15)
BUN: <5 mg/dL — ABNORMAL LOW (ref 6–20)
BUN: <5 mg/dL — ABNORMAL LOW (ref 6–20)
CO2: 18 mmol/L — ABNORMAL LOW (ref 22–32)
CO2: 19 mmol/L — ABNORMAL LOW (ref 22–32)
Calcium: 9.1 mg/dL (ref 8.9–10.3)
Calcium: 7.8 mg/dL — ABNORMAL LOW (ref 8.9–10.3)
Chloride: 103 mmol/L (ref 98–111)
Chloride: 102 mmol/L (ref 98–111)
Creatinine, Ser: 0.69 mg/dL (ref 0.44–1.00)
Creatinine, Ser: 0.79 mg/dL (ref 0.44–1.00)
GFR calc Af Amer: >60 mL/min (ref >60)
GFR calc Af Amer: >60 mL/min (ref >60)
GFR calc non Af Amer: >60 mL/min (ref >60)
GFR calc non Af Amer: >60 mL/min (ref >60)
Glucose, Bld: 85 mg/dL (ref 70–99)
Glucose, Bld: 117 mg/dL — ABNORMAL HIGH (ref 70–99)
Potassium: 4.1 mmol/L (ref 3.5–5.1)
Potassium: 3.6 mmol/L (ref 3.5–5.1)
Sodium: 136 mmol/L (ref 135–145)
Sodium: 132 mmol/L — ABNORMAL LOW (ref 135–145)
Total Bilirubin: 0.8 mg/dL (ref 0.3–1.2)
Total Bilirubin: 0.9 mg/dL (ref 0.3–1.2)
Total Protein: 6.6 g/dL (ref 6.5–8.1)
Total Protein: 6.7 g/dL (ref 6.5–8.1)

## 2019-10-08 LAB — CBC
HCT: 36.2 % (ref 36.0–46.0)
HCT: 35.7 % — ABNORMAL LOW (ref 36.0–46.0)
Hemoglobin: 12.4 g/dL (ref 12.0–15.0)
Hemoglobin: 12.5 g/dL (ref 12.0–15.0)
MCH: 29.5 pg (ref 26.0–34.0)
MCH: 29.9 pg (ref 26.0–34.0)
MCHC: 34.3 g/dL (ref 30.0–36.0)
MCHC: 35 g/dL (ref 30.0–36.0)
MCV: 86 fL (ref 80.0–100.0)
MCV: 85.4 fL (ref 80.0–100.0)
Platelets: 159 10*3/uL (ref 150–400)
Platelets: 170 10*3/uL (ref 150–400)
RBC: 4.21 MIL/uL (ref 3.87–5.11)
RBC: 4.18 MIL/uL (ref 3.87–5.11)
RDW: 13.2 % (ref 11.5–15.5)
RDW: 12.9 % (ref 11.5–15.5)
WBC: 11.1 10*3/uL — ABNORMAL HIGH (ref 4.0–10.5)
WBC: 13.1 10*3/uL — ABNORMAL HIGH (ref 4.0–10.5)
nRBC: 0 % (ref 0.0–0.2)
nRBC: 0 % (ref 0.0–0.2)

## 2019-10-08 LAB — ABO/RH: ABO/RH(D): A POS

## 2019-10-08 LAB — TYPE AND SCREEN
ABO/RH(D): A POS
Antibody Screen: NEGATIVE

## 2019-10-08 LAB — PROTEIN / CREATININE RATIO, URINE
Creatinine, Urine: 97.46 mg/dL
Protein Creatinine Ratio: 0.14 mg/mg{Cre} (ref 0.00–0.15)
Total Protein, Urine: 14 mg/dL

## 2019-10-08 LAB — POCT FERN TEST: POCT Fern Test: POSITIVE

## 2019-10-08 LAB — URINALYSIS, ROUTINE W REFLEX MICROSCOPIC
Bilirubin Urine: NEGATIVE
Glucose, UA: NEGATIVE mg/dL
Hgb urine dipstick: NEGATIVE
Ketones, ur: NEGATIVE mg/dL
Leukocytes,Ua: NEGATIVE
Nitrite: NEGATIVE
Protein, ur: NEGATIVE mg/dL
Specific Gravity, Urine: 1.011 (ref 1.005–1.030)
pH: 7 (ref 5.0–8.0)

## 2019-10-08 LAB — SARS CORONAVIRUS 2 BY RT PCR (HOSPITAL ORDER, PERFORMED IN ~~LOC~~ HOSPITAL LAB): SARS Coronavirus 2: NEGATIVE

## 2019-10-08 MED ORDER — LACTATED RINGERS IV SOLN: 500.0000 mL | INTRAVENOUS | Status: DC | PRN

## 2019-10-08 MED ORDER — PHENYLEPHRINE 40 MCG/ML (10ML) SYRINGE FOR IV PUSH (FOR BLOOD PRESSURE SUPPORT)
80.0000 ug | PREFILLED_SYRINGE | INTRAVENOUS | Status: DC | PRN
  Filled 2019-10-08: qty 10

## 2019-10-08 MED ORDER — MAGNESIUM SULFATE 40 GM/1000ML IV SOLN
INTRAVENOUS | Status: AC
  Filled 2019-10-08: qty 1000

## 2019-10-08 MED ORDER — LACTATED RINGERS IV SOLN: 500.0000 mL | Freq: Once | INTRAVENOUS | Status: DC

## 2019-10-08 MED ORDER — HYDROXYZINE HCL 25 MG PO TABS
12.5000 mg | ORAL_TABLET | Freq: Three times a day (TID) | ORAL | Status: DC | PRN
  Administered 2019-10-08 (×2): 12.5 mg via ORAL
  Filled 2019-10-08 (×3): qty 1

## 2019-10-08 MED ORDER — MISOPROSTOL 25 MCG QUARTER TABLET
25.0000 ug | ORAL_TABLET | ORAL | Status: DC
  Administered 2019-10-08: 25 ug via VAGINAL

## 2019-10-08 MED ORDER — LIDOCAINE-EPINEPHRINE (PF) 2 %-1:200000 IJ SOLN
INTRAMUSCULAR | Status: DC | PRN
  Administered 2019-10-08: 2 mL via EPIDURAL
  Administered 2019-10-08: 3 mL via EPIDURAL

## 2019-10-08 MED ORDER — MAGNESIUM SULFATE BOLUS VIA INFUSION
4.0000 g | Freq: Once | INTRAVENOUS | Status: AC
  Administered 2019-10-08: 4 g via INTRAVENOUS
  Filled 2019-10-08: qty 1000

## 2019-10-08 MED ORDER — ACETAMINOPHEN 325 MG PO TABS: 650.0000 mg | ORAL_TABLET | ORAL | Status: DC | PRN

## 2019-10-08 MED ORDER — LIDOCAINE HCL (PF) 1 % IJ SOLN
INTRAMUSCULAR | Status: AC
  Administered 2019-10-08: 30 mL
  Filled 2019-10-08: qty 30

## 2019-10-08 MED ORDER — TERBUTALINE SULFATE 1 MG/ML IJ SOLN: 0.2500 mg | Freq: Once | INTRAMUSCULAR | Status: DC | PRN

## 2019-10-08 MED ORDER — ONDANSETRON HCL 4 MG/2ML IJ SOLN
4.0000 mg | Freq: Four times a day (QID) | INTRAMUSCULAR | Status: DC | PRN
  Administered 2019-10-08: 4 mg via INTRAVENOUS
  Filled 2019-10-08 (×2): qty 2

## 2019-10-08 MED ORDER — OXYTOCIN-SODIUM CHLORIDE 30-0.9 UT/500ML-% IV SOLN
2.5000 [IU]/h | INTRAVENOUS | Status: DC
  Administered 2019-10-09: 2.5 [IU]/h via INTRAVENOUS

## 2019-10-08 MED ORDER — FENTANYL-BUPIVACAINE-NACL 0.5-0.125-0.9 MG/250ML-% EP SOLN
12.0000 mL/h | EPIDURAL | Status: DC | PRN
  Filled 2019-10-08: qty 250

## 2019-10-08 MED ORDER — SODIUM CHLORIDE (PF) 0.9 % IJ SOLN
INTRAMUSCULAR | Status: DC | PRN
  Administered 2019-10-08: 12 mL/h via EPIDURAL

## 2019-10-08 MED ORDER — MAGNESIUM SULFATE 40 GM/1000ML IV SOLN
2.0000 g/h | INTRAVENOUS | Status: DC
  Administered 2019-10-08: 2 g/h via INTRAVENOUS

## 2019-10-08 MED ORDER — LEVONORGESTREL 19.5 MCG/DAY IU IUD
INTRAUTERINE_SYSTEM | Freq: Once | INTRAUTERINE | Status: AC
  Administered 2019-10-08: 1 via INTRAUTERINE
  Filled 2019-10-08: qty 1

## 2019-10-08 MED ORDER — PHENYLEPHRINE 40 MCG/ML (10ML) SYRINGE FOR IV PUSH (FOR BLOOD PRESSURE SUPPORT)
80.0000 ug | PREFILLED_SYRINGE | INTRAVENOUS | Status: DC | PRN
  Administered 2019-10-08: 80 ug via INTRAVENOUS
  Filled 2019-10-08 (×2): qty 10

## 2019-10-08 MED ORDER — FENTANYL CITRATE (PF) 100 MCG/2ML IJ SOLN
INTRAMUSCULAR | Status: AC
  Filled 2019-10-08: qty 2

## 2019-10-08 MED ORDER — METHYLERGONOVINE MALEATE 0.2 MG/ML IJ SOLN
INTRAMUSCULAR | Status: AC
  Filled 2019-10-08: qty 1

## 2019-10-08 MED ORDER — METHYLERGONOVINE MALEATE 0.2 MG/ML IJ SOLN
0.2000 mg | Freq: Once | INTRAMUSCULAR | Status: AC
  Administered 2019-10-08: 0.2 mg via INTRAMUSCULAR

## 2019-10-08 MED ORDER — TERBUTALINE SULFATE 1 MG/ML IJ SOLN
0.2500 mg | Freq: Once | INTRAMUSCULAR | Status: DC | PRN
  Filled 2019-10-08: qty 1

## 2019-10-08 MED ORDER — FENTANYL CITRATE (PF) 100 MCG/2ML IJ SOLN
50.0000 ug | INTRAMUSCULAR | Status: DC | PRN
  Administered 2019-10-08 (×2): 100 ug via INTRAVENOUS
  Filled 2019-10-08: qty 2

## 2019-10-08 MED ORDER — LACTATED RINGERS IV SOLN: INTRAVENOUS | Status: DC

## 2019-10-08 MED ORDER — EPHEDRINE 5 MG/ML INJ: 10.0000 mg | INTRAVENOUS | Status: DC | PRN

## 2019-10-08 MED ORDER — DIPHENHYDRAMINE HCL 50 MG/ML IJ SOLN: 12.5000 mg | INTRAMUSCULAR | Status: DC | PRN

## 2019-10-08 MED ORDER — OXYTOCIN BOLUS FROM INFUSION
500.0000 mL | Freq: Once | INTRAVENOUS | Status: AC
  Administered 2019-10-08: 500 mL via INTRAVENOUS

## 2019-10-08 MED ORDER — SOD CITRATE-CITRIC ACID 500-334 MG/5ML PO SOLN: 30.0000 mL | ORAL | Status: DC | PRN

## 2019-10-08 MED ORDER — OXYTOCIN-SODIUM CHLORIDE 30-0.9 UT/500ML-% IV SOLN
1.0000 m[IU]/min | INTRAVENOUS | Status: DC
  Administered 2019-10-08: 2 m[IU]/min via INTRAVENOUS
  Filled 2019-10-08: qty 500

## 2019-10-08 MED ORDER — LIDOCAINE HCL (PF) 1 % IJ SOLN: 30.0000 mL | INTRAMUSCULAR | Status: DC | PRN

## 2019-10-08 MED ORDER — MISOPROSTOL 25 MCG QUARTER TABLET
ORAL_TABLET | ORAL | Status: AC
  Filled 2019-10-08: qty 1

## 2019-10-08 MED ORDER — EPHEDRINE 5 MG/ML INJ
10.0000 mg | INTRAVENOUS | Status: DC | PRN
  Filled 2019-10-08: qty 2

## 2019-10-08 NOTE — MAU Note (Signed)
Patient presented to MAU at [redacted]w[redacted]d gestation with c/o SROM at 0630 today, clear fluid. Patient denies vaginal bleeding, patient feels fetal movement, and is feeling contractions about every 10 minutes.

## 2019-10-08 NOTE — Progress Notes (Addendum)
Patient ID: Tracy [name], female   DOB: 07/24/1995, 24 y.o.   MRN: [id number]  Tracy [name] is a 24 y.o. G1P0 at [redacted]w[redacted]d admitted for SROM and pre-eclampsia now with severe features by LFTs.  Subjective: Not as anxious or nauseous now.  Feeling some contractions.  Objective: BP (!) 135/103   Pulse 78   Temp 98 F (36.7 C) (Oral)   Resp 16   Ht 5\' 4"  (1.626 m)   Wt 76 kg   LMP 01/11/2019   SpO2 99%   BMI 28.76 kg/m  Total I/O In: 486.1 [P.O.:100; I.V.:386.1] Out: 1550 [Urine:1550]  FHT:  FHR: 120 bpm, variability: moderate,  accelerations:  Present,  decelerations:  Present occasional variable UC:   irregular  SVE:   1.5/100% per nurse   Labs: Lab Results  Component Value Date   WBC 13.1 (H) 10/08/2019   HGB 12.5 10/08/2019   HCT 35.7 (L) 10/08/2019   MCV 85.4 10/08/2019   PLT 170 10/08/2019    Assessment / Plan: Tracy [name] is a 24 y.o. G1P0 at [redacted]w[redacted]d admitted for SROM and pre-eclampsia, now with severe features by LFTs.  Labor: Foley bulb out at 1315.  Progressing well.  Start pitocin. Fetal Wellbeing:  Category I Pain Control:  IV pain meds, epidural upon request Pre-eclampsia with severe features by LFts: Continue magnesium I/D:  GBS negative Anticipated MOD:  Vaginal delivery  Gaines Cartmell [redacted]w[redacted]d, MD PGY-2 Resident Family Medicine 10/08/2019, 3:33 PM

## 2019-10-08 NOTE — Anesthesia Procedure Notes (Signed)
Epidural Patient location during procedure: OB Start time: 10/08/2019 6:10 PM End time: 10/08/2019 6:25 PM  Staffing Anesthesiologist: Elmer Picker, MD Performed: anesthesiologist   Preanesthetic Checklist Completed: patient identified, IV checked, risks and benefits discussed, monitors and equipment checked, pre-op evaluation and timeout performed  Epidural Patient position: sitting Prep: DuraPrep and site prepped and draped Patient monitoring: continuous pulse ox, blood pressure, heart rate and cardiac monitor Approach: midline Location: L3-L4 Injection technique: LOR air  Needle:  Needle type: Tuohy  Needle gauge: 17 G Needle length: 9 cm Needle insertion depth: 5 cm Catheter type: closed end flexible Catheter size: 19 Gauge Catheter at skin depth: 10 cm Test dose: negative  Assessment Sensory level: T8 Events: blood not aspirated, injection not painful, no injection resistance, no paresthesia and negative IV test  Additional Notes Patient identified. Risks/Benefits/Options discussed with patient including but not limited to bleeding, infection, nerve damage, paralysis, failed block, incomplete pain control, headache, blood pressure changes, nausea, vomiting, reactions to medication both or allergic, itching and postpartum back pain. Confirmed with bedside nurse the patient's most recent platelet count. Confirmed with patient that they are not currently taking any anticoagulation, have any bleeding history or any family history of bleeding disorders. Patient expressed understanding and wished to proceed. All questions were answered. Sterile technique was used throughout the entire procedure. Please see nursing notes for vital signs. Test dose was given through epidural catheter and negative prior to continuing to dose epidural or start infusion. Warning signs of high block given to the patient including shortness of breath, tingling/numbness in hands, complete motor block, or  any concerning symptoms with instructions to call for help. Patient was given instructions on fall risk and not to get out of bed. All questions and concerns addressed with instructions to call with any issues or inadequate analgesia.  Reason for block:procedure for pain

## 2019-10-08 NOTE — Progress Notes (Signed)
Patient ID: BRADLEY HANDYSIDE, female   DOB: 16-Oct-1995, 24 y.o.   MRN: [id number]  Charmika Macdonnell Beesonis a 24 y.o.G1P0 at [redacted]w[redacted]d admitted for SROM and pre-eclampsia now with severe features by LFTs.  Subjective: Feeling a bit better.  Has epidural and more comfortable.    Objective: BP 112/63   Pulse (!) 59   Temp (!) 97.5 F (36.4 C) (Oral)   Resp 16   Ht 5\' 4"  (1.626 m)   Wt 76 kg   LMP 01/11/2019   SpO2 99%   BMI 28.76 kg/m  No intake/output data recorded.  FHT:  FHR: 120 bpm, variability: moderate,  accelerations:  Present,  decelerations:  Absent UC:   Not tracing well  SVE:   Dilation: 6 Effacement (%): 100 Station: -1 Exam by:: Dr [email or web address]  Pitocin @ 10 mu/min  Labs: Lab Results  Component Value Date   WBC 13.1 (H) 10/08/2019   HGB 12.5 10/08/2019   HCT 35.7 (L) 10/08/2019   MCV 85.4 10/08/2019   PLT 170 10/08/2019    Assessment / Plan: Rebekka S Beesonis a 24 y.o.G1P0 at [redacted]w[redacted]d admitted for SROM and pre-eclampsia, now with severe features by LFTs.  Labor:Progressing well.  Contractions not tracing well.  IUPC placed at 1925. Fetal Wellbeing:Category I Pain Control:IV pain meds, epidural upon request Pre-eclampsia with severe features by LFts:Continue magnesium I/D:GBS negative Anticipated [redacted]w[redacted]d delivery  Mattalynn Crandle ZJI:RCVELFY, MD PGY-2 Resident Family Medicine 10/08/2019, 7:34 PM

## 2019-10-08 NOTE — Progress Notes (Addendum)
LABOR PROGRESS NOTE  Tracy [name] is a 24 y.o. G1P0 at [redacted]w[redacted]d  admitted for SROM.  Subjective: Feeling very anxious  Objective: BP 105/63   Pulse 64   Temp (!) 97.5 F (36.4 C) (Oral)   Resp 17   Ht 5\' 4"  (1.626 m)   Wt 76 kg   LMP 01/11/2019   SpO2 99%   BMI 28.76 kg/m  or  Vitals:   10/08/19 1901 10/08/19 1907 10/08/19 1931 10/08/19 2001  BP: 122/72 112/63 92/70 105/63  Pulse: 69 (!) 59 63 64  Resp:    17  Temp: (!) 97.5 F (36.4 C)     TempSrc: Oral     SpO2:      Weight:      Height:         Dilation: 5 Effacement (%): 90 Station: 0 Presentation: Vertex Exam by:: Auther Lyerly FHT: baseline rate 120, moderate varibility, -acel, recurrent late decel Toco: q2-3 min  Labs: Lab Results  Component Value Date   WBC 13.1 (H) 10/08/2019   HGB 12.5 10/08/2019   HCT 35.7 (L) 10/08/2019   MCV 85.4 10/08/2019   PLT 170 10/08/2019    Patient Active Problem List   Diagnosis Date Noted  . Premature rupture of membranes 10/08/2019  . IUGR 09/28/2019  . BMI 30.0-30.9,adult 09/08/2019  . Rubella non-immune status, antepartum 04/05/2019  . Tetrahydrocannabinol (THC) use disorder, mild, abuse 04/04/2019  . Supervision of low-risk pregnancy 03/23/2019  . Obesity in pregnancy, antepartum 03/23/2019  . Depression   . Anxiety   . Asthma   . Moderate persistent asthma 03/22/2015  . Nausea and vomiting during pregnancy 07/07/2013    Assessment / Plan: 24 y.o. G1P0 at [redacted]w[redacted]d here for SROM.  Labor: s/p SROM @ 0630, FB, miso x1, and on pitocin since 1544 w IUPC placed 1925. On my exam closer to 5cm and urine foley bulb trapped between baby head and pubic bone, it was elevated with some descent of infant noted. Called to room for recurrent deep late decelerations, pitocin turned off, given 400cc bolus, and tracing observed with improvement in decelerations over approximately 15 min. Not yet in active labor, will allow period of recovery and monitor contractions, pitocin  augmentation PRN once she returns to Cat I strip Fetal Wellbeing:  Cat II at present but slowly trending towards Cat I, will monitor closely Pain Control:  Epidural, working well GBS: neg Anticipated MOD:  SVD  PreE w SF: elevated BP's and notably elevated LFTs on admission. PreE vs atypical HELLP. Currently normotensive, on Mg gtt, will trend LFTs, next ordered for 0500, coags if increased c/f HELLP.   Lab Results  Component Value Date   ALT 175 (H) 10/08/2019   AST 183 (H) 10/08/2019   ALKPHOS 189 (H) 10/08/2019   BILITOT 0.9 10/08/2019     IUGR: 6% on last 12/08/2019 09/20/2019  Contraception: desires post placental IUD, ordered and consented  09/22/2019, MD/[facility] OB Fellow  10/08/2019, 9:50 PM

## 2019-10-08 NOTE — Progress Notes (Signed)
Patient ID: Tracy [name], female   DOB: [birth year], 24 y.o.   MRN: [id number]  Tracy [name] is a 24 y.o. G1P0 at [redacted]w[redacted]d admitted for SROM and pre-eclampsia.  Subjective: Feels anxious and nauseous.  Attributes nausea to anxiety.  Discussed pre-eclampsia.  Agreeable to cytotec and foley bulb placement.    Objective: BP (!) 133/96   Pulse 95   Temp 98 F (36.7 C) (Oral)   Resp 16   LMP 01/11/2019   SpO2 100%  No intake/output data recorded.  FHT:  FHR: 12 bpm, variability: moderate-marked,  accelerations:  Present,  decelerations:  Absent UC:   irregular  SVE:   Dilation: 1.5 Effacement (%): 50 Station: -2 Exam by:: Johnanna Schneiders RN  Labs: Lab Results  Component Value Date   WBC 11.1 (H) 10/08/2019   HGB 12.4 10/08/2019   HCT 36.2 10/08/2019   MCV 86.0 10/08/2019   PLT 159 10/08/2019    Assessment / Plan: Tracy [name] is a 24 y.o. G1P0 at [redacted]w[redacted]d admitted for SROM and pre-eclampsia.  Labor: Foley bulb and vaginal cytotec x1 placed.   Fetal Wellbeing:  Category I Pain Control:  IV pain meds Pre-eclampsia: LFTs elevated.  On Magnesium. I/D:  GBS negative Anticipated MOD:  Vaginal delivery  Anniece Bleiler Genene Churn, MD PGY-2 Resident Family Medicine 10/08/2019, 11:39 AM

## 2019-10-08 NOTE — Anesthesia Preprocedure Evaluation (Signed)
Anesthesia Evaluation  Patient identified by MRN, date of birth, ID band Patient awake    Reviewed: Allergy & Precautions, NPO status , Patient's Chart, lab work & pertinent test results  Airway Mallampati: II  TM Distance: >3 FB Neck ROM: Full    Dental no notable dental hx.    Pulmonary asthma , Current SmokerPatient did not abstain from smoking.,    Pulmonary exam normal breath sounds clear to auscultation       Cardiovascular hypertension, Normal cardiovascular exam Rhythm:Regular Rate:Normal     Neuro/Psych PSYCHIATRIC DISORDERS Anxiety Depression negative neurological ROS     GI/Hepatic negative GI ROS, Neg liver ROS,   Endo/Other  negative endocrine ROS  Renal/GU negative Renal ROS  negative genitourinary   Musculoskeletal negative musculoskeletal ROS (+)   Abdominal   Peds  Hematology negative hematology ROS (+)   Anesthesia Other Findings IOL for preE on mag  Reproductive/Obstetrics (+) Pregnancy                             Anesthesia Physical Anesthesia Plan  ASA: III  Anesthesia Plan: Epidural   Post-op Pain Management:    Induction:   PONV Risk Score and Plan: Treatment may vary due to age or medical condition  Airway Management Planned: Natural Airway  Additional Equipment:   Intra-op Plan:   Post-operative Plan:   Informed Consent: I have reviewed the patients History and Physical, chart, labs and discussed the procedure including the risks, benefits and alternatives for the proposed anesthesia with the patient or authorized representative who has indicated his/her understanding and acceptance.       Plan Discussed with: Anesthesiologist  Anesthesia Plan Comments: (Patient identified. Risks, benefits, options discussed with patient including but not limited to bleeding, infection, nerve damage, paralysis, failed block, incomplete pain control, headache,  blood pressure changes, nausea, vomiting, reactions to medication, itching, and post partum back pain. Confirmed with bedside nurse the patient's most recent platelet count. Confirmed with the patient that they are not taking any anticoagulation, have any bleeding history or any family history of bleeding disorders. Patient expressed understanding and wishes to proceed. All questions were answered. )        Anesthesia Quick Evaluation

## 2019-10-08 NOTE — H&P (Signed)
OBSTETRIC ADMISSION HISTORY AND PHYSICAL  Tracy [name] is a 24 y.o. female G1P0 with IUP at 84w4dpresenting for SROM at 0630. She reports +FMs. No LOF, VB, blurry vision, headaches, peripheral edema, or RUQ pain. She plans on breast feeding. She requests post placental IUD for birth control.  Dating: By 2nd trimester [place]--->  Estimated Date of Delivery: 10/18/19  Sono:   _0 , normal anatomy, cephalic presentation, 29767H 6%ile, EFW 4#15  Prenatal History/Complications: Obesity Anxiety/DEpression Asthma Rubella non-immune status IUGR (6%)- normal dopplers Polyhydramnios- AFI 26.6  Past Medical History: Past Medical History:  Diagnosis Date   Abdominal pain    Anxiety    Asthma    Complication of anesthesia    panic attack    Depression    IUGR (intrauterine growth restriction) affecting care of mother    IUGR, antenatal    Polyhydramnios affecting pregnancy     Past Surgical History: Past Surgical History:  Procedure Laterality Date   ADENOIDECTOMY     CHOLECYSTECTOMY  05/2016   ESOPHAGOGASTRODUODENOSCOPY N/A 08/12/2013   Procedure: ESOPHAGOGASTRODUODENOSCOPY (EGD);  Surgeon: JOletha Blend MD;  [facility];  Service: Endoscopy;  Laterality: N/A;   TYMPANOSTOMY TUBE PLACEMENT      Obstetrical History: OB History    Gravida  1   Para      Term      Preterm      AB      Living  0     SAB      TAB      Ectopic      Multiple      Live Births              Social History: Social History   Socioeconomic History   Marital status: Married    Spouse name: Not on file   Number of children: Not on file   Years of education: Not on file   Highest education level: Not on file  Occupational History   Not on file  Tobacco Use   Smoking status: Current Some Day Smoker   Smokeless tobacco: Never Used   Tobacco comment: smokes marijuana approx 1 x week  Substance and Sexual Activity   Alcohol use: Not Currently     Comment: rarely   Drug use: Not Currently    Types: Marijuana    Comment: last smoked 5/25   Sexual activity: Yes    Birth control/protection: None  Other Topics Concern   Not on file  Social History Narrative   10th grade 2014-2015   Social Determinants of Health   Financial Resource Strain:    Difficulty of Paying Living Expenses:   Food Insecurity: No Food Insecurity   Worried About Running Out of Food in the Last Year: Never true   Ran Out of Food in the Last Year: Never true  Transportation Needs: No Transportation Needs   Lack of Transportation (Medical): No   Lack of Transportation (Non-Medical): No  Physical Activity:    Days of Exercise per Week:    Minutes of Exercise per Session:   Stress:    Feeling of Stress :   Social Connections:    Frequency of Communication with Friends and Family:    Frequency of Social Gatherings with Friends and Family:    Attends Religious Services:    Active Member of Clubs or Organizations:    Attends CArchivistMeetings:    Marital Status:     Family History: Family History  Problem Relation Age of Onset   Nephrolithiasis Father    Depression Father    Depression Mother    Anxiety disorder Mother    Drug abuse Mother    Celiac disease Neg Hx    Ulcers Neg Hx    Cholelithiasis Neg Hx     Allergies: No Known Allergies  Medications Prior to Admission  Medication Sig Dispense Refill Last Dose   albuterol (PROAIR HFA) 108 (90 Base) MCG/ACT inhaler Can inhale two puffs every four to six hours as needed for cough or wheeze. 18 g 1 10/08/2019 at Unknown time   albuterol (PROVENTIL) (2.5 MG/3ML) 0.083% nebulizer solution Take 2.5 mg by nebulization every 6 (six) hours as needed for wheezing or shortness of breath.   Past Month at Unknown time   aspirin EC 81 MG tablet Take 1 tablet (81 mg total) by mouth daily. Take after 12 weeks for prevention of preeclampsia later in pregnancy. Start  taking daily on 04/05/2019 300 tablet 2 10/07/2019 at Unknown time   fluticasone (FLOVENT HFA) 220 MCG/ACT inhaler Inhale 2 puffs into the lungs 2 (two) times daily. 1 Inhaler 12 10/07/2019 at Unknown time   ketoconazole (NIZORAL) 2 % shampoo U SHAMPOO QOD UTD  5 Past Week at Unknown time   Magnesium 200 MG TABS Take 1 tablet (200 mg total) by mouth at bedtime. 30 tablet 2 10/07/2019 at Unknown time   Prenatal Vit-Fe Fumarate-FA (PRENATAL VITAMIN PO) Take 1 tablet by mouth daily.   10/07/2019 at Unknown time   sertraline (ZOLOFT) 50 MG tablet Take 3 tablets (150 mg total) by mouth daily. 90 tablet 3 10/07/2019 at Unknown time   Blood Pressure Monitoring (BLOOD PRESSURE KIT) DEVI 1 Device by Does not apply route as needed. 1 each 0    hydrOXYzine (ATARAX/VISTARIL) 10 MG tablet Take 10 mg by mouth 3 (three) times daily as needed.   More than a month at Unknown time   ondansetron (ZOFRAN ODT) 4 MG disintegrating tablet Take 1-2 tablets (4-8 mg total) by mouth every 6 (six) hours as needed for nausea or vomiting. 30 tablet 1 Unknown at Unknown time   sertraline (ZOLOFT) 100 MG tablet take 1.5 tablet by oral route  every day        Review of Systems:  All systems reviewed and negative except as stated in HPI  PE: Blood pressure 126/71, pulse 75, temperature 98 F (36.7 C), temperature source Oral, resp. rate 18, last menstrual period 01/11/2019, SpO2 99 %. General appearance: alert, cooperative and appears stated age Lungs: regular rate and effort Heart: regular rate  Abdomen: soft, non-tender Extremities: Homans sign is negative, no sign of DVT Presentation: cephalic EFM: 563 bpm, moderate variability, 15x15 accels, no decels Toco: irregular, q3-6 minutes Dilation: 1.5 Effacement (%): 50 Station: -2 Exam by:: Jerelyn Scott RN  Prenatal labs: ABO, Rh: --/--/PENDING (06/05 0930) Antibody: PENDING (06/05 0930) Rubella: <0.90 (11/30 1012) RPR: Non Reactive (03/24 1032)  HBsAg: Negative  (11/30 1012)  HIV: Non Reactive (03/24 1032)  GBS: Negative/-- (05/18 1528)  2 hr GTT 79/112/56  Prenatal Transfer Tool  Maternal Diabetes: No Genetic Screening: Normal Maternal Ultrasounds/Referrals: IUGR, polyhydramnios Fetal Ultrasounds or other Referrals:  Referred to Materal Fetal Medicine  Maternal Substance Abuse:  Yes:  Type: Marijuana Significant Maternal Medications:  None Significant Maternal Lab Results: Group B Strep negative  Results for orders placed or performed during the hospital encounter of 10/08/19 (from the past 24 hour(s))  POCT fern test  Collection Time: 10/08/19  8:56 AM  Result Value Ref Range   POCT Fern Test Positive = ruptured amniotic membanes   CBC   Collection Time: 10/08/19  9:15 AM  Result Value Ref Range   WBC 11.1 (H) 4.0 - 10.5 K/uL   RBC 4.21 3.87 - 5.11 MIL/uL   Hemoglobin 12.4 12.0 - 15.0 g/dL   HCT 36.2 36.0 - 46.0 %   MCV 86.0 80.0 - 100.0 fL   MCH 29.5 26.0 - 34.0 pg   MCHC 34.3 30.0 - 36.0 g/dL   RDW 13.2 11.5 - 15.5 %   Platelets 159 150 - 400 K/uL   nRBC 0.0 0.0 - 0.2 %  Type and screen   Collection Time: 10/08/19  9:30 AM  Result Value Ref Range   ABO/RH(D) PENDING    Antibody Screen PENDING    Sample Expiration      10/11/2019,2359 Performed at [facility], [place] [address] [place], [place] [address]     Patient Active Problem List   Diagnosis Date Noted   IUGR 09/28/2019   BMI 30.0-30.9,adult 09/08/2019   Rubella non-immune status, antepartum 04/05/2019   Tetrahydrocannabinol (THC) use disorder, mild, abuse 04/04/2019   Supervision of low-risk pregnancy 03/23/2019   Obesity in pregnancy, antepartum 03/23/2019   Depression    Anxiety    Asthma    Moderate persistent asthma 03/22/2015   Nausea and vomiting during pregnancy 07/07/2013    Assessment: Tracy [name] is a 24 y.o. G1P0 at 64w4dhere for SROM  1. Labor: Feeling contractions, expectant management for now. Consider augmentation as  appropriate 2. FWB: Cat I, EFW 6# 3. Pain: per patient request 4. GBS: negative 5. Elevated BP- no signs/symptoms of Pre-E, labs pending   Plan: Admit to L&D, anticipate NSVD  Kazuto Sevey L Chalsey Leeth, DO  10/08/2019, 10:11 AM

## 2019-10-09 ENCOUNTER — Encounter (HOSPITAL_COMMUNITY): Payer: Self-pay | Admitting: Family Medicine

## 2019-10-09 DIAGNOSIS — Z3A38 38 weeks gestation of pregnancy: Secondary | ICD-10-CM

## 2019-10-09 DIAGNOSIS — Z30014 Encounter for initial prescription of intrauterine contraceptive device: Secondary | ICD-10-CM

## 2019-10-09 DIAGNOSIS — O1424 HELLP syndrome, complicating childbirth: Secondary | ICD-10-CM

## 2019-10-09 DIAGNOSIS — O4202 Full-term premature rupture of membranes, onset of labor within 24 hours of rupture: Secondary | ICD-10-CM

## 2019-10-09 LAB — CBC
HCT: 37.2 % (ref 36.0–46.0)
HCT: 34.9 % — ABNORMAL LOW (ref 36.0–46.0)
HCT: 35.6 % — ABNORMAL LOW (ref 36.0–46.0)
Hemoglobin: 12.8 g/dL (ref 12.0–15.0)
Hemoglobin: 12.1 g/dL (ref 12.0–15.0)
Hemoglobin: 12.6 g/dL (ref 12.0–15.0)
MCH: 29.5 pg (ref 26.0–34.0)
MCH: 29.7 pg (ref 26.0–34.0)
MCH: 30.4 pg (ref 26.0–34.0)
MCHC: 34.4 g/dL (ref 30.0–36.0)
MCHC: 34.7 g/dL (ref 30.0–36.0)
MCHC: 35.4 g/dL (ref 30.0–36.0)
MCV: 85.7 fL (ref 80.0–100.0)
MCV: 85.5 fL (ref 80.0–100.0)
MCV: 85.8 fL (ref 80.0–100.0)
Platelets: 165 10*3/uL (ref 150–400)
Platelets: 150 10*3/uL (ref 150–400)
Platelets: 179 10*3/uL (ref 150–400)
RBC: 4.34 MIL/uL (ref 3.87–5.11)
RBC: 4.08 MIL/uL (ref 3.87–5.11)
RBC: 4.15 MIL/uL (ref 3.87–5.11)
RDW: 12.9 % (ref 11.5–15.5)
RDW: 12.8 % (ref 11.5–15.5)
RDW: 13.1 % (ref 11.5–15.5)
WBC: 20.6 10*3/uL — ABNORMAL HIGH (ref 4.0–10.5)
WBC: 21.7 10*3/uL — ABNORMAL HIGH (ref 4.0–10.5)
WBC: 21.6 10*3/uL — ABNORMAL HIGH (ref 4.0–10.5)
nRBC: 0 % (ref 0.0–0.2)
nRBC: 0 % (ref 0.0–0.2)
nRBC: 0 % (ref 0.0–0.2)

## 2019-10-09 LAB — COMPREHENSIVE METABOLIC PANEL
ALT: 210 U/L — ABNORMAL HIGH (ref 0–44)
ALT: 208 U/L — ABNORMAL HIGH (ref 0–44)
AST: 239 U/L — ABNORMAL HIGH (ref 15–41)
AST: 208 U/L — ABNORMAL HIGH (ref 15–41)
Albumin: 2.4 g/dL — ABNORMAL LOW (ref 3.5–5.0)
Albumin: 2.6 g/dL — ABNORMAL LOW (ref 3.5–5.0)
Alkaline Phosphatase: 179 U/L — ABNORMAL HIGH (ref 38–126)
Alkaline Phosphatase: 172 U/L — ABNORMAL HIGH (ref 38–126)
Anion gap: 10 (ref 5–15)
Anion gap: 13 (ref 5–15)
BUN: <5 mg/dL — ABNORMAL LOW (ref 6–20)
BUN: <5 mg/dL — ABNORMAL LOW (ref 6–20)
CO2: 19 mmol/L — ABNORMAL LOW (ref 22–32)
CO2: 20 mmol/L — ABNORMAL LOW (ref 22–32)
Calcium: 7.3 mg/dL — ABNORMAL LOW (ref 8.9–10.3)
Calcium: 7.2 mg/dL — ABNORMAL LOW (ref 8.9–10.3)
Chloride: 103 mmol/L (ref 98–111)
Chloride: 101 mmol/L (ref 98–111)
Creatinine, Ser: 0.73 mg/dL (ref 0.44–1.00)
Creatinine, Ser: 0.69 mg/dL (ref 0.44–1.00)
GFR calc Af Amer: >60 mL/min (ref >60)
GFR calc Af Amer: >60 mL/min (ref >60)
GFR calc non Af Amer: >60 mL/min (ref >60)
GFR calc non Af Amer: >60 mL/min (ref >60)
Glucose, Bld: 131 mg/dL — ABNORMAL HIGH (ref 70–99)
Glucose, Bld: 120 mg/dL — ABNORMAL HIGH (ref 70–99)
Potassium: 3.4 mmol/L — ABNORMAL LOW (ref 3.5–5.1)
Potassium: 3.5 mmol/L (ref 3.5–5.1)
Sodium: 132 mmol/L — ABNORMAL LOW (ref 135–145)
Sodium: 134 mmol/L — ABNORMAL LOW (ref 135–145)
Total Bilirubin: 0.8 mg/dL (ref 0.3–1.2)
Total Bilirubin: 0.5 mg/dL (ref 0.3–1.2)
Total Protein: 5.9 g/dL — ABNORMAL LOW (ref 6.5–8.1)
Total Protein: 5.9 g/dL — ABNORMAL LOW (ref 6.5–8.1)

## 2019-10-09 LAB — APTT: aPTT: 26 seconds (ref 24–36)

## 2019-10-09 LAB — PROTIME-INR
INR: 1.1 (ref 0.8–1.2)
Prothrombin Time: 13.8 seconds (ref 11.4–15.2)

## 2019-10-09 LAB — RPR: RPR Ser Ql: NONREACTIVE

## 2019-10-09 LAB — FIBRINOGEN: Fibrinogen: 666 mg/dL — ABNORMAL HIGH (ref 210–475)

## 2019-10-09 MED ORDER — IBUPROFEN 600 MG PO TABS
600.0000 mg | ORAL_TABLET | Freq: Four times a day (QID) | ORAL | Status: DC
  Administered 2019-10-09 – 2019-10-10 (×6): 600 mg via ORAL
  Filled 2019-10-09 (×7): qty 1

## 2019-10-09 MED ORDER — LABETALOL HCL 5 MG/ML IV SOLN: 20.0000 mg | INTRAVENOUS | Status: DC | PRN

## 2019-10-09 MED ORDER — LACTATED RINGERS IV SOLN: INTRAVENOUS | Status: AC

## 2019-10-09 MED ORDER — OXYCODONE HCL 5 MG PO TABS: 5.0000 mg | ORAL_TABLET | ORAL | Status: DC | PRN

## 2019-10-09 MED ORDER — COCONUT OIL OIL: 1.0000 "application " | TOPICAL_OIL | Status: DC | PRN

## 2019-10-09 MED ORDER — DIPHENHYDRAMINE HCL 25 MG PO CAPS: 25.0000 mg | ORAL_CAPSULE | Freq: Four times a day (QID) | ORAL | Status: DC | PRN

## 2019-10-09 MED ORDER — ONDANSETRON HCL 4 MG/2ML IJ SOLN: 4.0000 mg | INTRAMUSCULAR | Status: DC | PRN

## 2019-10-09 MED ORDER — AMLODIPINE BESYLATE 5 MG PO TABS
5.0000 mg | ORAL_TABLET | Freq: Every day | ORAL | Status: DC
  Administered 2019-10-09 – 2019-10-10 (×2): 5 mg via ORAL
  Filled 2019-10-09 (×2): qty 1

## 2019-10-09 MED ORDER — OXYCODONE HCL 5 MG PO TABS: 10.0000 mg | ORAL_TABLET | ORAL | Status: DC | PRN

## 2019-10-09 MED ORDER — MAGNESIUM HYDROXIDE 400 MG/5ML PO SUSP: 30.0000 mL | ORAL | Status: DC | PRN

## 2019-10-09 MED ORDER — MAGNESIUM SULFATE 40 GM/1000ML IV SOLN
2.0000 g/h | INTRAVENOUS | Status: AC
  Administered 2019-10-09 (×2): 2 g/h via INTRAVENOUS
  Filled 2019-10-09: qty 1000

## 2019-10-09 MED ORDER — LACTATED RINGERS IV SOLN: INTRAVENOUS | Status: DC

## 2019-10-09 MED ORDER — SODIUM CHLORIDE 0.9 % IV SOLN: 2.0000 g | Freq: Four times a day (QID) | INTRAVENOUS | Status: DC

## 2019-10-09 MED ORDER — PRENATAL MULTIVITAMIN CH
1.0000 | ORAL_TABLET | Freq: Every day | ORAL | Status: DC
  Administered 2019-10-09 – 2019-10-10 (×2): 1 via ORAL
  Filled 2019-10-09 (×2): qty 1

## 2019-10-09 MED ORDER — DIBUCAINE (PERIANAL) 1 % EX OINT: 1.0000 "application " | TOPICAL_OINTMENT | CUTANEOUS | Status: DC | PRN

## 2019-10-09 MED ORDER — BENZOCAINE-MENTHOL 20-0.5 % EX AERO
1.0000 "application " | INHALATION_SPRAY | CUTANEOUS | Status: DC | PRN
  Administered 2019-10-09: 1 via TOPICAL
  Filled 2019-10-09: qty 56

## 2019-10-09 MED ORDER — LABETALOL HCL 5 MG/ML IV SOLN: 40.0000 mg | INTRAVENOUS | Status: DC | PRN

## 2019-10-09 MED ORDER — HYDRALAZINE HCL 20 MG/ML IJ SOLN: 10.0000 mg | INTRAMUSCULAR | Status: DC | PRN

## 2019-10-09 MED ORDER — ACETAMINOPHEN 325 MG PO TABS: 650.0000 mg | ORAL_TABLET | ORAL | Status: DC | PRN

## 2019-10-09 MED ORDER — SIMETHICONE 80 MG PO CHEW: 80.0000 mg | CHEWABLE_TABLET | ORAL | Status: DC | PRN

## 2019-10-09 MED ORDER — ONDANSETRON HCL 4 MG PO TABS: 4.0000 mg | ORAL_TABLET | ORAL | Status: DC | PRN

## 2019-10-09 MED ORDER — WITCH HAZEL-GLYCERIN EX PADS: 1.0000 "application " | MEDICATED_PAD | CUTANEOUS | Status: DC | PRN

## 2019-10-09 MED ORDER — SENNOSIDES-DOCUSATE SODIUM 8.6-50 MG PO TABS
2.0000 | ORAL_TABLET | ORAL | Status: DC
  Administered 2019-10-10: 2 via ORAL
  Filled 2019-10-09: qty 2

## 2019-10-09 MED ORDER — SERTRALINE HCL 50 MG PO TABS
150.0000 mg | ORAL_TABLET | Freq: Every day | ORAL | Status: DC
  Administered 2019-10-09: 150 mg via ORAL
  Filled 2019-10-09: qty 3

## 2019-10-09 MED ORDER — LABETALOL HCL 5 MG/ML IV SOLN: 80.0000 mg | INTRAVENOUS | Status: DC | PRN

## 2019-10-09 MED ORDER — LACTATED RINGERS AMNIOINFUSION
INTRAVENOUS | Status: DC
  Filled 2019-10-09 (×3): qty 1000

## 2019-10-09 NOTE — Anesthesia Postprocedure Evaluation (Signed)
Anesthesia Post Note  Patient: Tracy [name]  Procedure(s) Performed: AN AD HOC LABOR EPIDURAL     Patient location during evaluation: Mother Baby Anesthesia Type: Epidural Level of consciousness: awake and alert Pain management: pain level controlled Vital Signs Assessment: post-procedure vital signs reviewed and stable Respiratory status: spontaneous breathing, nonlabored ventilation and respiratory function stable Cardiovascular status: stable Postop Assessment: no headache, no backache, epidural receding, no apparent nausea or vomiting, patient able to bend at knees, adequate PO intake and able to ambulate Anesthetic complications: no    Last Vitals:  Vitals:   10/09/19 0221 10/09/19 0323  BP: (!) 141/87 123/75  Pulse: 70 71  Resp: 17 16  Temp: (!) 36.3 C 36.8 C  SpO2: 98% 96%    Last Pain:  Vitals:   10/09/19 0608  TempSrc:   PainSc: 0-No pain   Pain Goal: Patients Stated Pain Goal: 0 (10/08/19 0829)                 Laban Emperor

## 2019-10-09 NOTE — Progress Notes (Signed)
POSTPARTUM PROGRESS NOTE  Post Partum Day 1  Subjective:  Tracy [name] is a 24 y.o. G1P1001 s/p VAVD for fetal bradycardia at [redacted]w[redacted]d.  She reports she is doing well. No acute events overnight. She denies any problems with ambulating, voiding or po intake. Denies nausea or vomiting.  Pain is well controlled.  Lochia is like a period she thinks, though didn't take a good look. Denies HA, vision changes, chest pain, SOB, RUQ pain, LE edema.   Objective: Blood pressure 123/75, pulse 71, temperature 98.2 F (36.8 C), temperature source Oral, resp. rate 16, height 5\' 4"  (1.626 m), weight 76 kg, last menstrual period 01/11/2019, SpO2 96 %, unknown if currently breastfeeding.  Physical Exam:  General: alert, cooperative and no distress Chest: no respiratory distress Heart:regular rate, distal pulses intact Abdomen: soft, nontender,  Uterine Fundus: firm, appropriately tender DVT Evaluation: No calf swelling or tenderness Extremities: no LE edema Skin: warm, dry  Recent Labs    10/09/19 0103 10/09/19 0604  HGB 12.8 12.1  HCT 37.2 34.9*    Assessment/Plan: Tracy [name] is a 24 y.o. G1P1001 s/p VAVD at [redacted]w[redacted]d   PPD#1 - Doing well  Routine postpartum care  Pre E w SF vs atypical HELLP:  Still on Mg until midnight, repeat labs this AM notable for mild downward trend in platelets 165>150, and LFTs mildly uptrending AST 183>239, ALT 175>210, repeat labs this afternoon. BP normotensive this AM, but intermittently mild range overnight, start amlodipine 5mg .   Contraception: Post placental IUD placed Feeding: Breast Dispo: Plan for discharge PPD#2.   LOS: 1 day   [redacted]w[redacted]d, MD/[facility] OB Fellow  10/09/2019, 8:42 AM

## 2019-10-09 NOTE — Lactation Note (Signed)
This note was copied from a baby's chart. Lactation Consultation Note LC attempted to see mom. Mom sleeping. DEBP set up at bedside. Baby in NICU. NICU booklet, Lactation pamphlet, and LPI (d/t 5.5 lbs) sheet left at bedside.  Lactation will f/u today.  Patient Name: Tracy [name] [id number] Date: 10/09/2019     Maternal Data    Feeding Feeding Type: Formula Nipple Type: Slow - flow  LATCH Score                   Interventions    Lactation Tools Discussed/Used     Consult Status      Shirrell Solinger G 10/09/2019, 5:50 AM

## 2019-10-09 NOTE — Discharge Summary (Signed)
Postpartum Discharge Summary    Patient Name: Tracy [name] DOB: 06-Jun-1995 MRN: [id number]  Date of admission: 10/08/2019 Delivery date:10/08/2019  Delivering provider: Clarnce Flock  Date of discharge: 10/10/2019  Admitting diagnosis: IUGR (intrauterine growth restriction) affecting care of mother [O36.5990] Intrauterine pregnancy: [redacted]w[redacted]d    Secondary diagnosis:  Principal Problem:   Premature rupture of membranes Active Problems:   Moderate persistent asthma   Supervision of low-risk pregnancy   Obesity in pregnancy, antepartum   Depression   Anxiety   Asthma   Rubella non-immune status, antepartum   IUGR   Preeclampsia, severe   Vacuum-assisted vaginal delivery      Discharge diagnosis: Term Pregnancy Delivered and Preeclampsia (severe)                                              Post partum procedures:Post-placental Liletta IUD placement Augmentation: Pitocin, Cytotec and IP Foley Complications: None  Hospital course: Onset of Labor With Vaginal Delivery      24y.o. yo G1P0 at 352w5das admitted in Latent Labor on 10/08/2019. Patient had an uncomplicated labor course as follows: Patient arrived at 1.5 cm dilation and was induced with a foley bulb and miso x1, subsequently started on pitocin. After seven hours on pitocin started to have decelerations. At bedside patient noted to be complete but with significant fetal bradycardia to the 50's, at which point a VAVD was performed. Membrane Rupture Time/Date: 6:30 AM ,10/08/2019   Delivery Method:Vaginal, Vacuum (Extractor)  Episiotomy: Median  Lacerations:  2nd degree  Patient had an uncomplicated postpartum course. She received magnesium sulfate for seizure prophylaxis and was started on amlodipine for BP control with good results. She is ambulating, tolerating a regular diet, passing flatus, and urinating well. Patient is discharged home in stable condition on 10/10/19.  Newborn Data: Birth date:10/08/2019  Birth time:11:45  PM  Gender:Female  Living status:Living  Apgars:1 ,8  Weight:2435 g   Magnesium Sulfate received: Yes: Seizure prophylaxis BMZ received: No Rhophylac:N/A T-DaP:Given prenatally Flu: Yes Transfusion:No  Physical exam  Vitals:   10/09/19 2311 10/10/19 0406 10/10/19 0531 10/10/19 0823  BP: 127/80 (!) 146/91 112/66 104/66  Pulse: 70 74 60 (!) 58  Resp: _0 Temp: 98.3 F (36.8 C)  98.6 F (37 C) 98 F (36.7 C)  TempSrc: Oral  Oral Oral  SpO2: 100% 100% 100% 97%  Weight:      Height:       General: alert, cooperative and no distress Lochia: appropriate Uterine Fundus: firm DVT Evaluation: No evidence of DVT seen on physical exam. Labs: Lab Results  Component Value Date   WBC 14.9 (H) 10/10/2019   HGB 11.5 (L) 10/10/2019   HCT 33.4 (L) 10/10/2019   MCV 86.8 10/10/2019   PLT 186 10/10/2019   CMP Latest Ref Rng & Units 10/10/2019  Glucose 70 - 99 mg/dL 93  BUN 6 - 20 mg/dL <5(L)  Creatinine 0.44 - 1.00 mg/dL 0.80  Sodium 135 - 145 mmol/L 136  Potassium 3.5 - 5.1 mmol/L 3.9  Chloride 98 - 111 mmol/L 103  CO2 22 - 32 mmol/L 24  Calcium 8.9 - 10.3 mg/dL 7.9(L)  Total Protein 6.5 - 8.1 g/dL 5.8(L)  Total Bilirubin 0.3 - 1.2 mg/dL 0.5  Alkaline Phos 38 - 126 U/L 155(H)  AST 15 - 41 U/L 190(H)  ALT 0 - 44 U/L 206(H)   Flavia Shipper Score: No flowsheet data found.   After visit meds:  Allergies as of 10/10/2019   No Known Allergies     Medication List    STOP taking these medications   aspirin EC 81 MG tablet     TAKE these medications   albuterol 108 (90 Base) MCG/ACT inhaler Commonly known as: ProAir HFA Can inhale two puffs every four to six hours as needed for cough or wheeze.   albuterol (2.5 MG/3ML) 0.083% nebulizer solution Commonly known as: PROVENTIL Take 2.5 mg by nebulization every 6 (six) hours as needed for wheezing or shortness of breath.   amLODipine 5 MG tablet Commonly known as: NORVASC Take 1 tablet (5 mg total) by mouth daily. Start  taking on: October 11, 2019   Blood Pressure Kit Devi 1 Device by Does not apply route as needed.   Flovent HFA 220 MCG/ACT inhaler Generic drug: fluticasone Inhale 2 puffs into the lungs 2 (two) times daily.   hydrOXYzine 10 MG tablet Commonly known as: ATARAX/VISTARIL Take 10 mg by mouth 3 (three) times daily as needed.   ibuprofen 600 MG tablet Commonly known as: ADVIL Take 1 tablet (600 mg total) by mouth every 6 (six) hours.   ketoconazole 2 % shampoo Commonly known as: NIZORAL U SHAMPOO QOD UTD   Magnesium 200 MG Tabs Take 1 tablet (200 mg total) by mouth at bedtime.   ondansetron 4 MG disintegrating tablet Commonly known as: Zofran ODT Take 1-2 tablets (4-8 mg total) by mouth every 6 (six) hours as needed for nausea or vomiting.   PRENATAL VITAMIN PO Take 1 tablet by mouth daily.   sertraline 100 MG tablet Commonly known as: ZOLOFT take 1.5 tablet by oral route  every day What changed: Another medication with the same name was removed. Continue taking this medication, and follow the directions you see here.        Discharge home in stable condition Infant Feeding: Breast Infant Disposition:home with mother Discharge instruction: per After Visit Summary and Postpartum booklet. Activity: Advance as tolerated. Pelvic rest for 6 weeks.  Diet: routine diet Future Appointments: Future Appointments  Date Time Provider [place]  10/17/2019  9:20 AM [facility] NURSE [facility]  11/15/2019  1:15 PM Starr Lake, CNM [facility]  12/05/2019  4:30 PM Kozlow, Donnamarie Poag, MD [facility] None   Follow up Visit:    In person postpartum visit in 6 weeks with the following provider: Any provider. Additional Postpartum F/U:BP check 1 week and IUD string check  High risk pregnancy complicated by: Pre-eclampsia w severe features Delivery mode:  Vaginal, Vacuum Neurosurgeon)  Anticipated Birth Control:  PP IUD placed   10/10/2019 Mora Bellman, MD

## 2019-10-09 NOTE — Procedures (Signed)
  Post-Placental IUD Insertion Procedure Note  Patient identified, informed consent signed prior to delivery, signed copy in chart, time out was performed.    Vaginal, labial and perineal areas thoroughly inspected for lacerations. 2nd degree laceration identified not repaired prior to insertion of Liletta IUD.    - IUD grasped between sterile gloved fingers. Sterile lubrication applied to sterile gloved hand for ease of insertion. Fundus identified through abdominal wall using non-insertion hand. IUD inserted to fundus with bimanual technique. IUD carefully released at the fundus and insertion hand gently removed from vagina.    Strings trimmed to the level of the cervix. Patient tolerated procedure well.  Lot # [id number] Expiration Date11/2024  Patient given post procedure instructions and IUD care card with expiration date.  Patient is asked to keep IUD strings tucked in her vagina until her postpartum follow up visit in 4-6 weeks. Patient advised to abstain from sexual intercourse and pulling on strings before her follow-up visit. Patient verbalized an understanding of the plan of care and agrees.

## 2019-10-09 NOTE — Lactation Note (Addendum)
This note was copied from a baby's chart. Lactation Consultation Note  Patient Name: Tracy [name] [id number] Date: 10/09/2019 Reason for consult: Initial assessment  1410 - 1437 - I conducted an initial consult with Tracy [name]. I came in earlier today, but she was sleeping at that time. LC tried to visit her last night, and Tracy [name] was asleep, but she left a DEBP in the room.  Baby had just received 15 mls of formula prior to my entry. I assisted her support person with swaddling her. I provided her with the LPI guidelines as baby is under 6 lbs and early term.  DEBP in the room, but Tracy [name] has not initiated. I helped her to initiate her first pump and taught pump settings, disassembly and cleaning.  Tracy [name] states that she is still quite tired. She will be on mag until later tonight. She anticipates discharge tomorrow if all goes well.  Tracy [name] does not have a pump at home. She does have WIC. I agreed to place a referral and indicated that her feeding intentions might impact eligibility.  As Tracy [name] pumped, I brought in some cleaning supplies, orange juice and a hat for baby. I encouraged parents to keep a hat on baby at all times.  I recommended feeding baby on demand 8-12 times a day and pumping with each feeding or at least 8 times a day. Tracy [name] states an interest in latching baby to the breast. I encouraged her to call lactation for help, when ready.   Maternal Data Formula Feeding for Exclusion: No Does the patient have breastfeeding experience prior to this delivery?: No   Interventions Interventions: Breast feeding basics reviewed;DEBP  Lactation Tools Discussed/Used Tools: Pump Breast pump type: Double-Electric Breast Pump WIC Program: Yes Pump Review: Setup, frequency, and cleaning Initiated by:: hl Date initiated:: 10/09/19   Consult Status Consult Status: Follow-up Date: 10/10/19 Follow-up type: In-patient    Walker Shadow 10/09/2019, 2:41  PM

## 2019-10-09 NOTE — Progress Notes (Signed)
Results for QUINNIE, BARCELO (MRN [id number]) as of 10/09/2019 07:53  Ref. Range 10/08/2019 14:21 10/08/2019 18:55 10/09/2019 01:03 10/09/2019 06:04  Platelets Latest Ref Range: 150 - 400 K/uL 170  165 150   Results for TAMSEN, REIST (MRN [id number]) as of 10/09/2019 07:53  Ref. Range 10/08/2019 18:55 10/09/2019 01:03 10/09/2019 06:04  AST Latest Ref Range: 15 - 41 U/L 183 (H)  239 (H)  ALT Latest Ref Range: 0 - 44 U/L 175 (H)  210 (H)   Dr. Shawnie Pons notified in regards to changing lab results. No new order given. Will continue to monitor. Carmelina Dane, RN

## 2019-10-10 ENCOUNTER — Other Ambulatory Visit (HOSPITAL_COMMUNITY): Payer: Medicaid Other | Attending: Family Medicine

## 2019-10-10 LAB — COMPREHENSIVE METABOLIC PANEL
ALT: 206 U/L — ABNORMAL HIGH (ref 0–44)
AST: 190 U/L — ABNORMAL HIGH (ref 15–41)
Albumin: 2.4 g/dL — ABNORMAL LOW (ref 3.5–5.0)
Alkaline Phosphatase: 155 U/L — ABNORMAL HIGH (ref 38–126)
Anion gap: 9 (ref 5–15)
BUN: <5 mg/dL — ABNORMAL LOW (ref 6–20)
CO2: 24 mmol/L (ref 22–32)
Calcium: 7.9 mg/dL — ABNORMAL LOW (ref 8.9–10.3)
Chloride: 103 mmol/L (ref 98–111)
Creatinine, Ser: 0.8 mg/dL (ref 0.44–1.00)
GFR calc Af Amer: >60 mL/min (ref >60)
GFR calc non Af Amer: >60 mL/min (ref >60)
Glucose, Bld: 93 mg/dL (ref 70–99)
Potassium: 3.9 mmol/L (ref 3.5–5.1)
Sodium: 136 mmol/L (ref 135–145)
Total Bilirubin: 0.5 mg/dL (ref 0.3–1.2)
Total Protein: 5.8 g/dL — ABNORMAL LOW (ref 6.5–8.1)

## 2019-10-10 LAB — CBC
HCT: 33.4 % — ABNORMAL LOW (ref 36.0–46.0)
Hemoglobin: 11.5 g/dL — ABNORMAL LOW (ref 12.0–15.0)
MCH: 29.9 pg (ref 26.0–34.0)
MCHC: 34.4 g/dL (ref 30.0–36.0)
MCV: 86.8 fL (ref 80.0–100.0)
Platelets: 186 10*3/uL (ref 150–400)
RBC: 3.85 MIL/uL — ABNORMAL LOW (ref 3.87–5.11)
RDW: 13.2 % (ref 11.5–15.5)
WBC: 14.9 10*3/uL — ABNORMAL HIGH (ref 4.0–10.5)
nRBC: 0 % (ref 0.0–0.2)

## 2019-10-10 MED ORDER — IBUPROFEN 600 MG PO TABS: 600.0000 mg | ORAL_TABLET | Freq: Four times a day (QID) | ORAL | 3 refills | Status: DC

## 2019-10-10 MED ORDER — AMLODIPINE BESYLATE 5 MG PO TABS: 5.0000 mg | ORAL_TABLET | Freq: Every day | ORAL | 1 refills | Status: DC

## 2019-10-10 NOTE — Discharge Instructions (Signed)
Postpartum Hypertension Postpartum hypertension is high blood pressure that remains higher than normal after childbirth. You may not realize that you have postpartum hypertension if your blood pressure is not being checked regularly. In most cases, postpartum hypertension will go away on its own, usually within a week of delivery. However, for some women, medical treatment is required to prevent serious complications, such as seizures or stroke. What are the causes? This condition may be caused by one or more of the following:  Hypertension that existed before pregnancy (chronic hypertension).  Hypertension that comes on as a result of pregnancy (gestational hypertension).  Hypertensive disorders during pregnancy (preeclampsia) or seizures in women who have high blood pressure during pregnancy (eclampsia).  A condition in which the liver, platelets, and red blood cells are damaged during pregnancy (HELLP syndrome).  A condition in which the thyroid produces too much hormones (hyperthyroidism).  Other rare problems of the nerves (neurological disorders) or blood disorders. In some cases, the cause may not be known. What increases the risk? The following factors may make you more likely to develop this condition:  Chronic hypertension. In some cases, this may not have been diagnosed before pregnancy.  Obesity.  Type 2 diabetes.  Kidney disease.  History of preeclampsia or eclampsia.  Other medical conditions that change the level of hormones in the body (hormonal imbalance). What are the signs or symptoms? As with all types of hypertension, postpartum hypertension may not have any symptoms. Depending on how high your blood pressure is, you may experience:  Headaches. These may be mild, moderate, or severe. They may also be steady, Lex[employer] Shampine, or sudden in onset (thunderclap headache).  Changes in your ability to see (visual changes).  Dizziness.  Shortness of breath.  Swelling  of your hands, feet, lower legs, or face. In some cases, you may have swelling in more than one of these locations.  Heart palpitations or a racing heartbeat.  Difficulty breathing while lying down.  Decrease in the amount of urine that you pass. Other rare signs and symptoms may include:  Sweating more than usual. This lasts longer than a few days after delivery.  Chest pain.  Sudden dizziness when you get up from sitting or lying down.  Seizures.  Nausea or vomiting.  Abdominal pain. How is this diagnosed? This condition may be diagnosed based on the results of a physical exam, blood pressure measurements, and blood and urine tests. You may also have other tests, such as a CT scan or an MRI, to check for other problems of postpartum hypertension. How is this treated? If blood pressure is high enough to require treatment, your options may include:  Medicines to reduce blood pressure (antihypertensives). Tell your health care provider if you are breastfeeding or if you plan to breastfeed. There are many antihypertensive medicines that are safe to take while breastfeeding.  Stopping medicines that may be causing hypertension.  Treating medical conditions that are causing hypertension.  Treating the complications of hypertension, such as seizures, stroke, or kidney problems. Your health care provider will also continue to monitor your blood pressure closely until it is within a safe range for you. Follow these instructions at home:  Take over-the-counter and prescription medicines only as told by your health care provider.  Return to your normal activities as told by your health care provider. Ask your health care provider what activities are safe for you.  Do not use any products that contain nicotine or tobacco, such as cigarettes and e-cigarettes. If   you need help quitting, ask your health care provider.  Keep all follow-up visits as told by your health care provider. This  is important. Contact a health care provider if:  Your symptoms get worse.  You have new symptoms, such as: ? A headache that does not get better. ? Dizziness. ? Visual changes. Get help right away if:  You suddenly develop swelling in your hands, ankles, or face.  You have sudden, rapid weight gain.  You develop difficulty breathing, chest pain, racing heartbeat, or heart palpitations.  You develop severe pain in your abdomen.  You have any symptoms of a stroke. "BE FAST" is an easy way to remember the main warning signs of a stroke: ? B - Balance. Signs are dizziness, sudden trouble walking, or loss of balance. ? E - Eyes. Signs are trouble seeing or a sudden change in vision. ? F - Face. Signs are sudden weakness or numbness of the face, or the face or eyelid drooping on one side. ? A - Arms. Signs are weakness or numbness in an arm. This happens suddenly and usually on one side of the body. ? S - Speech. Signs are sudden trouble speaking, slurred speech, or trouble understanding what people say. ? T - Time. Time to call emergency services. Write down what time symptoms started.  You have other signs of a stroke, such as: ? A sudden, severe headache with no known cause. ? Nausea or vomiting. ? Seizure. These symptoms may represent a serious problem that is an emergency. Do not wait to see if the symptoms will go away. Get medical help right away. Call your local emergency services (911 in the [place] Do not drive yourself to the hospital. Summary  Postpartum hypertension is high blood pressure that remains higher than normal after childbirth.  In most cases, postpartum hypertension will go away on its own, usually within a week of delivery.  For some women, medical treatment is required to prevent serious complications, such as seizures or stroke. This information is not intended to replace advice given to you by your health care provider. Make sure you discuss any questions  you have with your health care provider. Document Revised: 05/28/2018 Document Reviewed: 02/09/2017 Elsevier Patient Education  2020 [employer] Inc[employer]  Postpartum Care After Vaginal Delivery This sheet gives you information about how to care for yourself from the time you deliver your baby to up to 6-12 weeks after delivery (postpartum period). Your health care provider may also give you more specific instructions. If you have problems or questions, contact your health care provider. Follow these instructions at home: Vaginal bleeding  It is normal to have vaginal bleeding (lochia) after delivery. Wear a sanitary pad for vaginal bleeding and discharge. ? During the first week after delivery, the amount and appearance of lochia is often similar to a menstrual period. ? Over the next few weeks, it will gradually decrease to a dry, yellow-brown discharge. ? For most women, lochia stops completely by 4-6 weeks after delivery. Vaginal bleeding can vary from woman to woman.  Change your sanitary pads frequently. Watch for any changes in your flow, such as: ? A sudden increase in volume. ? A change in color. ? Large blood clots.  If you pass a blood clot from your vagina, save it and call your health care provider to discuss. Do not flush blood clots down the toilet before talking with your health care provider.  Do not use tampons or douches until your health   care provider says this is safe.  If you are not breastfeeding, your period should return 6-8 weeks after delivery. If you are feeding your child breast milk only (exclusive breastfeeding), your period may not return until you stop breastfeeding. Perineal care  Keep the area between the vagina and the anus (perineum) clean and dry as told by your health care provider. Use medicated pads and pain-relieving sprays and creams as directed.  If you had a cut in the perineum (episiotomy) or a tear in the vagina, check the area for signs of  infection until you are healed. Check for: ? More redness, swelling, or pain. ? Fluid or blood coming from the cut or tear. ? Warmth. ? Pus or a bad smell.  You may be given a squirt bottle to use instead of wiping to clean the perineum area after you go to the bathroom. As you start healing, you may use the squirt bottle before wiping yourself. Make sure to wipe gently.  To relieve pain caused by an episiotomy, a tear in the vagina, or swollen veins in the anus (hemorrhoids), try taking a warm sitz bath 2-3 times a day. A sitz bath is a warm water bath that is taken while you are sitting down. The water should only come up to your hips and should cover your buttocks. Breast care  Within the first few days after delivery, your breasts may feel heavy, full, and uncomfortable (breast engorgement). Milk may also leak from your breasts. Your health care provider can suggest ways to help relieve the discomfort. Breast engorgement should go away within a few days.  If you are breastfeeding: ? Wear a bra that supports your breasts and fits you well. ? Keep your nipples clean and dry. Apply creams and ointments as told by your health care provider. ? You may need to use breast pads to absorb milk that leaks from your breasts. ? You may have uterine contractions every time you breastfeed for up to several weeks after delivery. Uterine contractions help your uterus return to its normal size. ? If you have any problems with breastfeeding, work with your health care provider or lactation consultant.  If you are not breastfeeding: ? Avoid touching your breasts a lot. Doing this can make your breasts produce more milk. ? Wear a good-fitting bra and use cold packs to help with swelling. ? Do not squeeze out (express) milk. This causes you to make more milk. Intimacy and sexuality  Ask your health care provider when you can engage in sexual activity. This may depend on: ? Your risk of infection. ? How  fast you are healing. ? Your comfort and desire to engage in sexual activity.  You are able to get pregnant after delivery, even if you have not had your period. If desired, talk with your health care provider about methods of birth control (contraception). Medicines  Take over-the-counter and prescription medicines only as told by your health care provider.  If you were prescribed an antibiotic medicine, take it as told by your health care provider. Do not stop taking the antibiotic even if you start to feel better. Activity  Gradually return to your normal activities as told by your health care provider. Ask your health care provider what activities are safe for you.  Rest as much as possible. Try to rest or take a nap while your baby is sleeping. Eating and drinking   Drink enough fluid to keep your urine pale yellow.  Eat high-fiber   foods every day. These may help prevent or relieve constipation. High-fiber foods include: ? Whole grain cereals and breads. ? Brown rice. ? Beans. ? Fresh fruits and vegetables.  Do not try to lose weight quickly by cutting back on calories.  Take your prenatal vitamins until your postpartum checkup or until your health care provider tells you it is okay to stop. Lifestyle  Do not use any products that contain nicotine or tobacco, such as cigarettes and e-cigarettes. If you need help quitting, ask your health care provider.  Do not drink alcohol, especially if you are breastfeeding. General instructions  Keep all follow-up visits for you and your baby as told by your health care provider. Most women visit their health care provider for a postpartum checkup within the first 3-6 weeks after delivery. Contact a health care provider if:  You feel unable to cope with the changes that your child brings to your life, and these feelings do not go away.  You feel unusually sad or worried.  Your breasts become red, painful, or hard.  You have a  fever.  You have trouble holding urine or keeping urine from leaking.  You have little or no interest in activities you used to enjoy.  You have not breastfed at all and you have not had a menstrual period for 12 weeks after delivery.  You have stopped breastfeeding and you have not had a menstrual period for 12 weeks after you stopped breastfeeding.  You have questions about caring for yourself or your baby.  You pass a blood clot from your vagina. Get help right away if:  You have chest pain.  You have difficulty breathing.  You have sudden, severe leg pain.  You have severe pain or cramping in your lower abdomen.  You bleed from your vagina so much that you fill more than one sanitary pad in one hour. Bleeding should not be heavier than your heaviest period.  You develop a severe headache.  You faint.  You have blurred vision or spots in your vision.  You have bad-smelling vaginal discharge.  You have thoughts about hurting yourself or your baby. If you ever feel like you may hurt yourself or others, or have thoughts about taking your own life, get help right away. You can go to the nearest emergency department or call:  Your local emergency services (911 in the [place]  A suicide crisis helpline, such as the National Suicide Prevention Lifeline at 1-800-273-8255. This is open 24 hours a day. Summary  The period of time right after you deliver your newborn up to 6-12 weeks after delivery is called the postpartum period.  Gradually return to your normal activities as told by your health care provider.  Keep all follow-up visits for you and your baby as told by your health care provider. This information is not intended to replace advice given to you by your health care provider. Make sure you discuss any questions you have with your health care provider. Document Revised: 04/24/2017 Document Reviewed: 02/02/2017 Elsevier Patient Education  2020 Elsevier Inc.  

## 2019-10-10 NOTE — Clinical Social Work Maternal (Signed)
CLINICAL SOCIAL WORK MATERNAL/CHILD NOTE  Patient Details  Name: Tracy [name] MRN: [id number] Date of Birth: 11/04/1995  Date:  10/10/2019  Clinical Social Worker Initiating Note:  Ginnie Marich, LCSW Date/Time: Initiated:  10/10/19/1134     Child's Name:  Wylder Brick   Biological Parents:  Mother, Father(Father: Christian Potocki)   Need for Interpreter:  None   Reason for Referral:  Current Substance Use/Substance Use During Pregnancy , Behavioral Health Concerns   Address:  [address] [place] [address]    Phone number:  [phone] (home)     Additional phone number:   Household Members/Support Persons (HM/SP):   Household Member/Support Person 1, Household Member/Support Person 2   HM/SP Name Relationship DOB or Age  HM/SP -1 Christian Krichbaum FOB/Husband 07/23/1994  HM/SP -2   FOB's friend    HM/SP -3        HM/SP -4        HM/SP -5        HM/SP -6        HM/SP -7        HM/SP -8          Natural Supports (not living in the home):  Other (Comment), Extended Family   Professional Supports: Other (Comment)([facility] (Diana Cooper))   Employment: Unemployed   Type of Work:     Education:  High school graduate   Homebound arranged:    Financial Resources:  Medicaid   Other Resources:  WIC   Cultural/Religious Considerations Which May Impact Care:    Strengths:  Ability to meet basic needs , Home prepared for child , Psychotropic Medications   Psychotropic Medications:  Zoloft      Pediatrician:       Pediatrician List:   [place]    [place]    [place]    [place]    [place]    [place]      Pediatrician Fax Number:    Risk Factors/Current Problems:  Substance Use    Cognitive State:  Able to Concentrate , Alert , Loosening of Associations , Linear Thinking , Insightful , Goal Oriented    Mood/Affect:  Interested , Calm , Comfortable , Relaxed    CSW  Assessment: CSW met with MOB at bedside to discuss substance use during pregnancy and behavioral health concerns, FOB present. CSW introduced self and asked FOB to leave to speak with MOB privately, FOB left the room. CSW explained reason for consult. MOB presented calm, open, pleasant and remained engaged during assessment. MOB reported that she resides with FOB and FOB's friend. MOB reported that she receives WIC. MOB reported that they have all items needed to care for infant including a car seat and basinet. CSW inquired about MOB's support system, MOB reported that her husband, landlord (family member) and quite a few other people are supports.   CSW inquired about MOB's mental health history, MOB reported that she was diagnosed with anxiety and depression 11 years ago. MOB denied any current symptoms and reported that she is currently taking Zoloft. MOB reported that her Zoloft is prescribed by Dr. Diana Cooper at [facility] in [place]. MOB reported that the Zoloft is helpful. MOB denied any therapy participation. CSW inquired about how MOB is feeling emotionally after giving birth, MOB reported that she is feeling okay and only cried twice. CSW informed MOB that she is more susceptible to postpartum depression due to her mental health history, MOB   verbalized understanding. MOB presented calm with insight about her mental health. MOB did not demonstrate any acute mental health signs/symptoms. CSW assessed for safety, MOB denied SI, HI and domestic violence.   CSW provided education regarding the baby blues period vs. perinatal mood disorders, discussed treatment and gave resources for mental health follow up if concerns arise.  CSW recommends self-evaluation during the postpartum time period using the New Mom Checklist from Postpartum Progress and encouraged MOB to contact a medical professional if symptoms are noted at any time.    CSW provided review of Sudden Infant Death Syndrome  (SIDS) precautions.    CSW informed MOB about the hospital drug screen policy due to substance use during pregnancy. MOB confirmed marijuana use during pregnancy and reported last use as a week ago. MOB denied any additional substance use during pregnancy. CSW informed MOB that infant's UDS was positive for THC and a CPS report would be made. CSW explained CPS report process, MOB verbalized understanding and denied any questions/concerns.   CSW made a [place] DSS CPS report due to infant's positive UDS for THC to intake worker (Stacie Hazelwood). CPS to follow up with MOB within 72 hours.   CSW identifies no further need for intervention and no barriers to discharge at this time.   CSW Plan/Description:  Sudden Infant Death Syndrome (SIDS) Education, Perinatal Mood and Anxiety Disorder (PMADs) Education, CSW Will Continue to Monitor Umbilical Cord Tissue Drug Screen Results and Make Report if Warranted, Child Protective Service Report , Hospital Drug Screen Policy Information, No Further Intervention Required/No Barriers to Discharge    Sanaii Caporaso L Iasha Mccalister, LCSW 10/10/2019, 11:58 AM 

## 2019-10-10 NOTE — Lactation Note (Signed)
This note was copied from a baby's chart. Lactation Consultation Note  Patient Name: Tracy [name] [id number] Date: 10/10/2019 Reason for consult: Follow-up assessment  P1 mother whose infant is now 66 hours old.  This is an ETI at 38+5 weeks weighing < 6 lbs.  Mother's feeding preference is breast/bottle.  Mother has been formula feeding only.  Baby was out of the room receiving a circumcision when I arrived.  Mother had no questions/concerns related to breast feeding.  Reviewed plan for home.  Engorgement prevention/treatment discussed.  Showed mother how to convert her DEBP parts to a manual pump.  Reviewed manual pumping.  She does not have a DEBP for home use, however, a referral was placed yesterday to [facility].  Mother lives in [place] and received a call from the [facility] office.  She will follow up today about pump eligibility.  Mother has not been consistent with pumping and I reiterated the importance of this and discussed milk "coming to volume."  I am not certain mother will breastfeed; she may be more interested in obtaining the formula package after discharge.  Father present.  Baby returned from circumcision at the end of my visit.  Mother has our OP phone number for any questions after discharge.   Maternal Data    Feeding Feeding Type: Formula Nipple Type: Slow - flow  LATCH Score                   Interventions    Lactation Tools Discussed/Used     Consult Status Consult Status: Complete Date: 10/10/19 Follow-up type: Call as needed    Tracy [name] 10/10/2019, 10:57 AM

## 2019-10-11 ENCOUNTER — Inpatient Hospital Stay (HOSPITAL_COMMUNITY)
Admission: AD | Admit: 2019-10-11 | Payer: Medicaid Other | Source: Home / Self Care | Admitting: Obstetrics and Gynecology

## 2019-10-11 ENCOUNTER — Inpatient Hospital Stay (HOSPITAL_COMMUNITY): Payer: Medicaid Other

## 2019-10-11 ENCOUNTER — Encounter: Payer: Self-pay | Admitting: Family Medicine

## 2019-10-12 ENCOUNTER — Ambulatory Visit: Payer: Medicaid Other

## 2019-10-17 ENCOUNTER — Other Ambulatory Visit: Payer: Self-pay

## 2019-10-17 ENCOUNTER — Ambulatory Visit (INDEPENDENT_AMBULATORY_CARE_PROVIDER_SITE_OTHER): Payer: Medicaid Other | Admitting: *Deleted

## 2019-10-17 VITALS — BP 126/86 | HR 102 | Temp 98.6°F | Ht 61.0 in | Wt 149.2 lb

## 2019-10-17 DIAGNOSIS — Z0130 Encounter for examination of blood pressure without abnormal findings: Secondary | ICD-10-CM

## 2019-10-17 NOTE — Progress Notes (Signed)
Chart reviewed for nurse visit. Agree with plan of care.   Sharyon Cable, CNM 10/17/2019 3:27 PM

## 2019-10-17 NOTE — Progress Notes (Signed)
Pt here for BP check - 126/86, P- 102. She denies H/A or visual disturbances. Pt advised to continue weekly BP check and document results in Baby[facility]. PP appt scheduled on 7/13. Pt voiced understanding of all information and instructions given.

## 2019-11-15 ENCOUNTER — Other Ambulatory Visit: Payer: Self-pay

## 2019-11-15 ENCOUNTER — Encounter: Payer: Self-pay | Admitting: Student

## 2019-11-15 ENCOUNTER — Ambulatory Visit (INDEPENDENT_AMBULATORY_CARE_PROVIDER_SITE_OTHER): Payer: Medicaid Other | Admitting: Student

## 2019-11-15 VITALS — BP 119/74 | HR 80

## 2019-11-15 DIAGNOSIS — Z3493 Encounter for supervision of normal pregnancy, unspecified, third trimester: Secondary | ICD-10-CM

## 2019-11-15 DIAGNOSIS — Z1389 Encounter for screening for other disorder: Secondary | ICD-10-CM

## 2019-11-15 DIAGNOSIS — Z8759 Personal history of other complications of pregnancy, childbirth and the puerperium: Secondary | ICD-10-CM

## 2019-11-15 DIAGNOSIS — Z01419 Encounter for gynecological examination (general) (routine) without abnormal findings: Secondary | ICD-10-CM

## 2019-11-15 NOTE — Patient Instructions (Signed)
-Seek care with a primary care doctor if blood pressure still elevated after discontinuing amlodipine   Postpartum Care After Vaginal Delivery This sheet gives you information about how to care for yourself from the time you deliver your baby to up to 6-12 weeks after delivery (postpartum period). Your health care provider may also give you more specific instructions. If you have problems or questions, contact your health care provider. Follow these instructions at home: Vaginal bleeding  It is normal to have vaginal bleeding (lochia) after delivery. Wear a sanitary pad for vaginal bleeding and discharge. ? During the first week after delivery, the amount and appearance of lochia is often similar to a menstrual period. ? Over the next few weeks, it will gradually decrease to a dry, yellow-brown discharge. ? For most women, lochia stops completely by 4-6 weeks after delivery. Vaginal bleeding can vary from woman to woman.  Change your sanitary pads frequently. Watch for any changes in your flow, such as: ? A sudden increase in volume. ? A change in color. ? Large blood clots.  If you pass a blood clot from your vagina, save it and call your health care provider to discuss. Do not flush blood clots down the toilet before talking with your health care provider.  Do not use tampons or douches until your health care provider says this is safe.  If you are not breastfeeding, your period should return 6-8 weeks after delivery. If you are feeding your child breast milk only (exclusive breastfeeding), your period may not return until you stop breastfeeding. Perineal care  Keep the area between the vagina and the anus (perineum) clean and dry as told by your health care provider. Use medicated pads and pain-relieving sprays and creams as directed.  If you had a cut in the perineum (episiotomy) or a tear in the vagina, check the area for signs of infection until you are healed. Check for: ? More  redness, swelling, or pain. ? Fluid or blood coming from the cut or tear. ? Warmth. ? Pus or a bad smell.  You may be given a squirt bottle to use instead of wiping to clean the perineum area after you go to the bathroom. As you start healing, you may use the squirt bottle before wiping yourself. Make sure to wipe gently.  To relieve pain caused by an episiotomy, a tear in the vagina, or swollen veins in the anus (hemorrhoids), try taking a warm sitz bath 2-3 times a day. A sitz bath is a warm water bath that is taken while you are sitting down. The water should only come up to your hips and should cover your buttocks. Breast care  Within the first few days after delivery, your breasts may feel heavy, full, and uncomfortable (breast engorgement). Milk may also leak from your breasts. Your health care provider can suggest ways to help relieve the discomfort. Breast engorgement should go away within a few days.  If you are breastfeeding: ? Wear a bra that supports your breasts and fits you well. ? Keep your nipples clean and dry. Apply creams and ointments as told by your health care provider. ? You may need to use breast pads to absorb milk that leaks from your breasts. ? You may have uterine contractions every time you breastfeed for up to several weeks after delivery. Uterine contractions help your uterus return to its normal size. ? If you have any problems with breastfeeding, work with your health care provider or Advertising copywriter.  If you are not breastfeeding: ? Avoid touching your breasts a lot. Doing this can make your breasts produce more milk. ? Wear a good-fitting bra and use cold packs to help with swelling. ? Do not squeeze out (express) milk. This causes you to make more milk. Intimacy and sexuality  Ask your health care provider when you can engage in sexual activity. This may depend on: ? Your risk of infection. ? How fast you are healing. ? Your comfort and desire to  engage in sexual activity.  You are able to get pregnant after delivery, even if you have not had your period. If desired, talk with your health care provider about methods of birth control (contraception). Medicines  Take over-the-counter and prescription medicines only as told by your health care provider.  If you were prescribed an antibiotic medicine, take it as told by your health care provider. Do not stop taking the antibiotic even if you start to feel better. Activity  Gradually return to your normal activities as told by your health care provider. Ask your health care provider what activities are safe for you.  Rest as much as possible. Try to rest or take a nap while your baby is sleeping. Eating and drinking   Drink enough fluid to keep your urine pale yellow.  Eat high-fiber foods every day. These may help prevent or relieve constipation. High-fiber foods include: ? Whole grain cereals and breads. ? Brown rice. ? Beans. ? Fresh fruits and vegetables.  Do not try to lose weight quickly by cutting back on calories.  Take your prenatal vitamins until your postpartum checkup or until your health care provider tells you it is okay to stop. Lifestyle  Do not use any products that contain nicotine or tobacco, such as cigarettes and e-cigarettes. If you need help quitting, ask your health care provider.  Do not drink alcohol, especially if you are breastfeeding. General instructions  Keep all follow-up visits for you and your baby as told by your health care provider. Most women visit their health care provider for a postpartum checkup within the first 3-6 weeks after delivery. Contact a health care provider if:  You feel unable to cope with the changes that your child brings to your life, and these feelings do not go away.  You feel unusually sad or worried.  Your breasts become red, painful, or hard.  You have a fever.  You have trouble holding urine or keeping  urine from leaking.  You have little or no interest in activities you used to enjoy.  You have not breastfed at all and you have not had a menstrual period for 12 weeks after delivery.  You have stopped breastfeeding and you have not had a menstrual period for 12 weeks after you stopped breastfeeding.  You have questions about caring for yourself or your baby.  You pass a blood clot from your vagina. Get help right away if:  You have chest pain.  You have difficulty breathing.  You have sudden, severe leg pain.  You have severe pain or cramping in your lower abdomen.  You bleed from your vagina so much that you fill more than one sanitary pad in one hour. Bleeding should not be heavier than your heaviest period.  You develop a severe headache.  You faint.  You have blurred vision or spots in your vision.  You have bad-smelling vaginal discharge.  You have thoughts about hurting yourself or your baby. If you ever feel like  you may hurt yourself or others, or have thoughts about taking your own life, get help right away. You can go to the nearest emergency department or call:  Your local emergency services (911 in the [place]  A suicide crisis helpline, such as the National Suicide Prevention Lifeline at (743)241-3661. This is open 24 hours a day. Summary  The period of time right after you deliver your newborn up to 6-12 weeks after delivery is called the postpartum period.  Gradually return to your normal activities as told by your health care provider.  Keep all follow-up visits for you and your baby as told by your health care provider. This information is not intended to replace advice given to you by your health care provider. Make sure you discuss any questions you have with your health care provider. Document Revised: 04/24/2017 Document Reviewed: 02/02/2017 Elsevier Patient Education  2020 [employer].

## 2019-11-15 NOTE — Progress Notes (Signed)
    Post Partum Visit Note  Tracy [name] is a 24 y.o. [id number] female who presents for a postpartum visit. She is 5 weeks postpartum following a normal spontaneous vaginal delivery.  I have fully reviewed the prenatal and intrapartum course. The delivery was at 38/5 gestational weeks. She was induced for preeclampsia with severe features and  Anesthesia: epidural. Postpartum course has been uneventful. Baby is doing well. Baby is feeding by both breast and bottle - Breast milk in bottle. Bleeding staining only. Bowel function is normal. Bladder function is normal. Patient is not sexually active. Contraception method is IUD. Postpartum depression screening: negative.  The following portions of the patient's history were reviewed and updated as appropriate: allergies, current medications, past family history, past medical history, past social history, past surgical history and problem list.  Review of Systems Pertinent items are noted in HPI.    Objective:  Blood pressure 119/74, pulse 80, unknown if currently breastfeeding.  General:  alert, appears stated age and no distress   Breasts:  inspection negative, no nipple discharge or bleeding, no masses or nodularity palpable  Lungs: clear to auscultation bilaterally  Heart:  regular rate and rhythm, S1, S2 normal, no murmur, click, rub or gallop  Abdomen: soft, non-tender; bowel sounds normal; no masses,  no organomegaly   Vulva:  normal  Vagina: normal vagina, no discharge, exudate, lesion, or erythema;   Cervix:  Normal appearing, strings visualized  Corpus: not examined  Adnexa:  not evaluated  Rectal Exam: Not performed.        Assessment:    Healthy postpartum exam. Pap smear not done at today's visit.   Plan:   Essential components of care per ACOG recommendations:  1.  Mood and well being: Patient with negative depression screening today. Reviewed local resources for support.  - Patient does not use tobacco.  - hx of drug  use? No    2. Infant care and feeding:  -Patient currently breastmilk feeding? Yesf breastmilk feeding discussed return to work and pumping. If needed, patient was provided letter for work to allow for every 2-3 hr pumping breaks, and to be granted a private location to express breastmilk and refrigerated area to store breastmilk. Reviewed importance of draining breast regularly to support lactation. -Social determinants of health (SDOH) reviewed in EPIC.  3. Sexuality, contraception and birth spacing - Patient does not want a pregnancy in the next year.  Desired family size is 1 children.  - Reviewed forms of contraception in tiered fashion. Patient desired IUD today; was inserted PP.    - Discussed birth spacing of 18 months  4. Sleep and fatigue -Encouraged family/partner/community support of 4 hrs of uninterrupted sleep to help with mood and fatigue  5. Physical Recovery  - Discussed patients delivery  and complications - Patient had a 2nd degree laceration, perineal healing reviewed. Patient expressed understanding - Patient has urinary incontinence? No - Patient is safe to resume physical and sexual activity  6.  Health Maintenance - Last pap smear done 03/2019 and was normal with negative HPV.  7. Chronic Disease  8. Plan:  -IUD strings trimmed  -D/c amlodipine this week, will check BP at the end of the week. Patient instructed to follow up with PCP if BP is elevated.  -depression and anxiety well controlled; reports that she is in a "good place".   -Safe to resume intercourse   Marylene Land, CNM [facility] [employer], [facility]

## 2019-11-22 NOTE — Telephone Encounter (Signed)
Sure! I can call her tomorrow!

## 2019-12-05 ENCOUNTER — Ambulatory Visit: Payer: Medicaid Other | Admitting: Allergy and Immunology

## 2019-12-05 ENCOUNTER — Encounter: Payer: Self-pay | Admitting: Allergy and Immunology

## 2019-12-05 ENCOUNTER — Ambulatory Visit (INDEPENDENT_AMBULATORY_CARE_PROVIDER_SITE_OTHER): Payer: Medicaid Other | Admitting: Allergy and Immunology

## 2019-12-05 ENCOUNTER — Other Ambulatory Visit: Payer: Self-pay

## 2019-12-05 VITALS — BP 114/86 | HR 97 | Resp 18

## 2019-12-05 DIAGNOSIS — R748 Abnormal levels of other serum enzymes: Secondary | ICD-10-CM | POA: Diagnosis not present

## 2019-12-05 DIAGNOSIS — J4550 Severe persistent asthma, uncomplicated: Secondary | ICD-10-CM | POA: Diagnosis not present

## 2019-12-05 DIAGNOSIS — J3089 Other allergic rhinitis: Secondary | ICD-10-CM

## 2019-12-05 NOTE — Progress Notes (Signed)
[place] [facility] [place] [facility] [place] [facility] [place] [facility] [place]   Follow-up Note  Referring Provider: No ref. provider found Primary Provider: Patient, No Pcp Per Date of Office Visit: 12/05/2019  Subjective:   Tracy [name] (DOB: May 17, 1995) is a 24 y.o. female who [facility] in re-evaluation of the following:  HPI: Tracy [name] presents to this clinic in evaluation of asthma and allergic [name].  I last saw her in this clinic on 12 Sep 2019 at which point in time she was [redacted] weeks pregnant.  She delivered her baby on 08 October 2019.  He is presently breast-feeding.  She still continues to require a short acting bronchodilator twice a day for issues with coughing and wheezing and shortness of breath.  This continues even though she has remained on a anti-inflammatory agent for her airway on a consistent basis.  She has been on multiple controller agents in the past most recently has relied on the use of Flovent during her pregnancy.  She had very little issues with her upper airway.  Allergies as of 12/05/2019   No Known Allergies     Medication List    albuterol 108 (90 Base) MCG/ACT inhaler Commonly known as: ProAir HFA Can inhale two puffs every four to six hours as needed for cough or wheeze.   albuterol (2.5 MG/3ML) 0.083% nebulizer solution Commonly known as: PROVENTIL Take 2.5 mg by nebulization every 6 (six) hours as needed for wheezing or shortness of breath.   Flovent HFA 220 MCG/ACT inhaler Generic drug: fluticasone Inhale 2 puffs into the lungs 2 (two) times daily.   ketoconazole 2 % shampoo Commonly known as: NIZORAL U SHAMPOO QOD UTD   PRENATAL VITAMIN PO Take 1 tablet by mouth daily.   sertraline 100 MG tablet Commonly known as: ZOLOFT take 1.5 tablet by oral route  every day       Past Medical History:  Diagnosis Date  . Abdominal pain   . Anxiety   . Asthma   . Complication of anesthesia    panic attack     . Depression   . IUGR (intrauterine growth restriction) affecting care of mother   . IUGR, antenatal   . Polyhydramnios affecting pregnancy     Past Surgical History:  Procedure Laterality Date  . ADENOIDECTOMY    . CHOLECYSTECTOMY  05/2016  . ESOPHAGOGASTRODUODENOSCOPY N/A 08/12/2013   Procedure: ESOPHAGOGASTRODUODENOSCOPY (EGD);  Surgeon: Jon Gills, MD;  Location: [facility] ENDOSCOPY;  Service: Endoscopy;  Laterality: N/A;  . TYMPANOSTOMY TUBE PLACEMENT      Review of systems negative except as noted in HPI / PMHx or noted below:  Review of Systems  Constitutional: Negative.   HENT: Negative.   Eyes: Negative.   Respiratory: Negative.   Cardiovascular: Negative.   Gastrointestinal: Negative.   Genitourinary: Negative.   Musculoskeletal: Negative.   Skin: Negative.   Neurological: Negative.   Endo/Heme/Allergies: Negative.   Psychiatric/Behavioral: Negative.      Objective:   Vitals:   12/05/19 1512  BP: 114/86  Pulse: 97  Resp: 18  SpO2: 96%          Physical Exam Constitutional:      Appearance: She is not diaphoretic.  HENT:     Head: Normocephalic.     Right Ear: Tympanic membrane, ear canal and external ear normal.     Left Ear: Tympanic membrane, ear canal and external ear normal.     Nose: Nose  normal. No mucosal edema or rhinorrhea.     Mouth/Throat:     Pharynx: Uvula midline. No oropharyngeal exudate.  Eyes:     Conjunctiva/sclera: Conjunctivae normal.  Neck:     Thyroid: No thyromegaly.     Trachea: Trachea normal. No tracheal tenderness or tracheal deviation.  Cardiovascular:     Rate and Rhythm: Normal rate and regular rhythm.     Heart sounds: Normal heart sounds, S1 normal and S2 normal. No murmur heard.   Pulmonary:     Effort: No respiratory distress.     Breath sounds: No stridor. Wheezing (Expiratory wheezes posterior lung fields) present. No rales.  Lymphadenopathy:     Head:     Right side of head: No tonsillar adenopathy.      Left side of head: No tonsillar adenopathy.     Cervical: No cervical adenopathy.  Skin:    Findings: No erythema or rash.     Nails: There is no clubbing.  Neurological:     Mental Status: She is alert.     Diagnostics:    Spirometry was performed and demonstrated an FEV1 of 1.76 at 57 % of predicted.  Results of blood tests obtained 10 October 2019 identified alkaline phosphatase 150 5U/L, AST 190 U/L, ALT 206U/L, total bilirubin 0.5 mg/DL  Assessment and Plan:   1. Not well controlled severe persistent asthma   2. Tracy [name]   3. Elevated liver enzymes     1.  Start Symbicort 160-2 inhalations twice a day with spacer (Flovent replacement)  2.  If needed:   A.   ProAir HFA 2 inhalations or nebulization every 4-6 hours  B.  OTC antihistamine - loratadine 10 mg -1 tablet 1 time per day  3.  Blood - CBC w/D, CMP, Area 2 aeroallergen profile  4. Biologic agent?  5.  Return to clinic in 4 weeks or earlier if problem  We will see if Tracy [name] is a candidate for a biologic agent to treat her uncontrolled severe asthma.  I will contact her with the results of her blood test once they are available for review.  We will switch her from Flovent to Symbicort now that she has delivered her newborn boy.  As well, we will check her liver function test to make sure that her hepatitis that appeared to present around the time of her baby's delivery has resolved.  Tracy Schimke, MD Allergy / Immunology [facility]

## 2019-12-05 NOTE — Patient Instructions (Addendum)
  1.  Start Symbicort 160-2 inhalations twice a day with spacer (Flovent replacement)  2.  If needed:   A.   ProAir HFA 2 inhalations or nebulization every 4-6 hours  B.  OTC antihistamine - loratadine 10 mg -1 tablet 1 time per day  3.  Blood - CBC w/D, CMP, Area 2 aeroallergen profile  4. Biologic agent?  5.  Return to clinic in 4 weeks or earlier if problem

## 2019-12-06 ENCOUNTER — Encounter: Payer: Self-pay | Admitting: Allergy and Immunology

## 2019-12-09 LAB — ALLERGENS W/TOTAL IGE AREA 2

## 2019-12-09 LAB — CBC WITH DIFFERENTIAL
Basophils Absolute: 0.1 10*3/uL (ref 0.0–0.2)
Basos: 2 %
EOS (ABSOLUTE): 1 10*3/uL — ABNORMAL HIGH (ref 0.0–0.4)
Eos: 10 %
Hematocrit: 39.6 % (ref 34.0–46.6)
Hemoglobin: 13.3 g/dL (ref 11.1–15.9)
Immature Grans (Abs): 0.1 10*3/uL (ref 0.0–0.1)
Immature Granulocytes: 1 %
Lymphocytes Absolute: 2.5 10*3/uL (ref 0.7–3.1)
Lymphs: 27 %
MCH: 28.7 pg (ref 26.6–33.0)
MCHC: 33.6 g/dL (ref 31.5–35.7)
MCV: 85 fL (ref 79–97)
Monocytes Absolute: 0.8 10*3/uL (ref 0.1–0.9)
Monocytes: 8 %
Neutrophils Absolute: 5.1 10*3/uL (ref 1.4–7.0)
Neutrophils: 52 %
RBC: 4.64 x10E6/uL (ref 3.77–5.28)
RDW: 12.7 % (ref 11.7–15.4)
WBC: 9.5 10*3/uL (ref 3.4–10.8)

## 2019-12-09 LAB — COMPREHENSIVE METABOLIC PANEL
ALT: 26 IU/L (ref 0–32)
AST: 26 IU/L (ref 0–40)
Albumin/Globulin Ratio: 1.6 (ref 1.2–2.2)
Albumin: 4.7 g/dL (ref 3.9–5.0)
Alkaline Phosphatase: 112 IU/L (ref 48–121)
BUN/Creatinine Ratio: 13 (ref 9–23)
BUN: 11 mg/dL (ref 6–20)
Bilirubin Total: 0.3 mg/dL (ref 0.0–1.2)
CO2: 24 mmol/L (ref 20–29)
Calcium: 9.9 mg/dL (ref 8.7–10.2)
Chloride: 103 mmol/L (ref 96–106)
Creatinine, Ser: 0.86 mg/dL (ref 0.57–1.00)
GFR calc Af Amer: 111 mL/min/{1.73_m2} (ref >59)
GFR calc non Af Amer: 96 mL/min/{1.73_m2} (ref >59)
Globulin, Total: 2.9 g/dL (ref 1.5–4.5)
Glucose: 97 mg/dL (ref 65–99)
Potassium: 4.2 mmol/L (ref 3.5–5.2)
Sodium: 141 mmol/L (ref 134–144)
Total Protein: 7.6 g/dL (ref 6.0–8.5)

## 2019-12-23 ENCOUNTER — Other Ambulatory Visit: Payer: Self-pay | Admitting: *Deleted

## 2019-12-23 ENCOUNTER — Telehealth: Payer: Self-pay | Admitting: Allergy and Immunology

## 2019-12-23 MED ORDER — ALBUTEROL SULFATE HFA 108 (90 BASE) MCG/ACT IN AERS: INHALATION_SPRAY | RESPIRATORY_TRACT | 1 refills | Status: DC

## 2019-12-23 NOTE — Telephone Encounter (Signed)
Tracy [name] would like her Albuterol inhaler sent to [facility] in [place].

## 2019-12-23 NOTE — Telephone Encounter (Signed)
RX sent

## 2019-12-26 ENCOUNTER — Telehealth: Payer: Self-pay | Admitting: *Deleted

## 2019-12-26 ENCOUNTER — Other Ambulatory Visit: Payer: Self-pay

## 2019-12-26 ENCOUNTER — Ambulatory Visit (INDEPENDENT_AMBULATORY_CARE_PROVIDER_SITE_OTHER): Payer: Medicaid Other | Admitting: *Deleted

## 2019-12-26 DIAGNOSIS — J4550 Severe persistent asthma, uncomplicated: Secondary | ICD-10-CM | POA: Diagnosis not present

## 2019-12-26 MED ORDER — EPINEPHRINE 0.3 MG/0.3ML IJ SOAJ
INTRAMUSCULAR | 3 refills | Status: DC
Start: 2019-12-26 — End: 2022-04-14

## 2019-12-26 MED ORDER — BENRALIZUMAB 30 MG/ML ~~LOC~~ SOSY
30.0000 mg | PREFILLED_SYRINGE | SUBCUTANEOUS | Status: AC
  Administered 2019-12-26: 30 mg via SUBCUTANEOUS

## 2019-12-26 NOTE — Progress Notes (Signed)
Tracy [name] started Tracy [name] today. Will receive her first 3 injections every 4 weeks then switch to every 8 weeks. Reviewed all instructions and information, answered all questions. Epipen sent to pharmacy.

## 2019-12-26 NOTE — Telephone Encounter (Signed)
Mckenley is interested in administering her Harrington Challenger at home. I informed her that this could mean obtain a new PA. Please look into this.

## 2019-12-27 NOTE — Telephone Encounter (Signed)
T/C to patient advised Rx for pen sent to [facility] with instrux to bring in first time for instruction then she will need to reorder

## 2020-01-02 ENCOUNTER — Ambulatory Visit (INDEPENDENT_AMBULATORY_CARE_PROVIDER_SITE_OTHER): Payer: Medicaid Other | Admitting: Allergy and Immunology

## 2020-01-02 ENCOUNTER — Other Ambulatory Visit: Payer: Self-pay

## 2020-01-02 ENCOUNTER — Encounter: Payer: Self-pay | Admitting: Allergy and Immunology

## 2020-01-02 VITALS — BP 96/68 | HR 84 | Resp 22 | Ht 61.5 in | Wt 150.0 lb

## 2020-01-02 DIAGNOSIS — J4550 Severe persistent asthma, uncomplicated: Secondary | ICD-10-CM | POA: Diagnosis not present

## 2020-01-02 DIAGNOSIS — J3089 Other allergic rhinitis: Secondary | ICD-10-CM | POA: Diagnosis not present

## 2020-01-02 MED ORDER — SYMBICORT 160-4.5 MCG/ACT IN AERO
INHALATION_SPRAY | RESPIRATORY_TRACT | 5 refills | Status: DC
Start: 2020-01-02 — End: 2020-10-15

## 2020-01-02 MED ORDER — CETIRIZINE HCL 10 MG PO TABS
ORAL_TABLET | ORAL | 5 refills | Status: DC
Start: 2020-01-02 — End: 2020-10-15

## 2020-01-02 NOTE — Patient Instructions (Addendum)
  1.  Treat and prevent inflammation:   A. Symbicort 160-2 inhalations twice a day with spacer   B. Benralizumab injections  2.  If needed:   A.   ProAir HFA 2 inhalations or nebulization every 4-6 hours  B.  OTC antihistamine - loratadine/ Cetirizine 10 mg -1 tablet 1 time per day  3. Obtain fall flu vaccine and Covid vaccine    4. Return to clinic in December 2021 or earlier if problem

## 2020-01-02 NOTE — Progress Notes (Signed)
[place] - [place] - [place] - [place] - [place]   Follow-up Note  Referring Provider: No ref. provider found Primary Provider: Patient, No Pcp Per Date of Office Visit: 01/02/2020  Subjective:   Tracy [name] (DOB: 1995/10/03) is a 24 y.o. female who [facility] in re-evaluation of the following:  HPI: Tracy [name] returns to this clinic in evaluation of eosinophilic driven asthma and allergic rhinitis.  Her last visit to this clinic was 05 December 2019 at which point in time she continued to have significant issues with asthma and we evaluated her for possible biologic agent administration.  Based upon her eosinophil count of 1000 obtained 06 December 2019 we started her on benralizumab injections on 26 December 2019.  She can already tell a difference from her injection.  She still has some wheezing and coughing but that has improved.  She still must use a bronchodilator but she believes that she only needs to use this agent about 2 times per day at this point.  She has not had any side effects from the administration of her benralizumab.  Allergies as of 01/02/2020   No Known Allergies     Medication List      albuterol 108 (90 Base) MCG/ACT inhaler Commonly known as: ProAir HFA Can inhale two puffs every four to six hours as needed for cough or wheeze.   albuterol (2.5 MG/3ML) 0.083% nebulizer solution Commonly known as: PROVENTIL Take 2.5 mg by nebulization every 6 (six) hours as needed for wheezing or shortness of breath.   cetirizine 10 MG tablet Commonly known as: ZYRTEC Can take one tablet by mouth once daily if needed. Started by: Jessica Priest, MD   EPINEPHrine 0.3 mg/0.3 mL Soaj injection Commonly known as: EPI-PEN Use as directed for life threatening allergic reaction   Flovent HFA 220 MCG/ACT inhaler Generic drug: fluticasone Inhale 2 puffs into the lungs 2 (two) times daily.   ketoconazole 2 % shampoo Commonly  known as: NIZORAL U SHAMPOO QOD UTD   PRENATAL VITAMIN PO Take 1 tablet by mouth daily.   sertraline 100 MG tablet Commonly known as: ZOLOFT take 1.5 tablet by oral route  every day   Symbicort 160-4.5 MCG/ACT inhaler Generic drug: budesonide-formoterol Inhale two puffs using spacer twice daily to prevent cough or wheeze.  Rinse, gargle, and spit after use. Started by: Jessica Priest, MD       Past Medical History:  Diagnosis Date  . Abdominal pain   . Anxiety   . Asthma   . Complication of anesthesia    panic attack   . Depression   . IUGR (intrauterine growth restriction) affecting care of mother   . IUGR, antenatal   . Polyhydramnios affecting pregnancy     Past Surgical History:  Procedure Laterality Date  . ADENOIDECTOMY    . CHOLECYSTECTOMY  05/2016  . ESOPHAGOGASTRODUODENOSCOPY N/A 08/12/2013   Procedure: ESOPHAGOGASTRODUODENOSCOPY (EGD);  Surgeon: Jon Gills, MD;  Location: [facility] ENDOSCOPY;  Service: Endoscopy;  Laterality: N/A;  . TYMPANOSTOMY TUBE PLACEMENT      Review of systems negative except as noted in HPI / PMHx or noted below:  Review of Systems  Constitutional: Negative.   HENT: Negative.   Eyes: Negative.   Respiratory: Negative.   Cardiovascular: Negative.   Gastrointestinal: Negative.   Genitourinary: Negative.   Musculoskeletal: Negative.   Skin: Negative.   Neurological: Negative.   Endo/Heme/Allergies: Negative.   Psychiatric/Behavioral: Negative.  Objective:   Vitals:   01/02/20 1347  BP: 96/68  Pulse: 84  Resp: (!) 22  SpO2: 98%   Height: 5' 1.5" (156.2 cm)  Weight: 150 lb (68 kg)   Physical Exam Constitutional:      Appearance: She is not diaphoretic.  HENT:     Head: Normocephalic.     Right Ear: Tympanic membrane, ear canal and external ear normal.     Left Ear: Tympanic membrane, ear canal and external ear normal.     Nose: Nose normal. No mucosal edema or rhinorrhea.     Mouth/Throat:     Pharynx: Uvula  midline. No oropharyngeal exudate.  Eyes:     Conjunctiva/sclera: Conjunctivae normal.  Neck:     Thyroid: No thyromegaly.     Trachea: Trachea normal. No tracheal tenderness or tracheal deviation.  Cardiovascular:     Rate and Rhythm: Normal rate and regular rhythm.     Heart sounds: Normal heart sounds, S1 normal and S2 normal. No murmur heard.   Pulmonary:     Effort: No respiratory distress.     Breath sounds: Normal breath sounds. No stridor. No wheezing or rales.  Lymphadenopathy:     Head:     Right side of head: No tonsillar adenopathy.     Left side of head: No tonsillar adenopathy.     Cervical: No cervical adenopathy.  Skin:    Findings: No erythema or rash.     Nails: There is no clubbing.  Neurological:     Mental Status: She is alert.     Diagnostics:    Spirometry was performed and demonstrated an FEV1 of 2.29 at 74 % of predicted.  Results of blood tests obtained 06 December 2019 identifies WBC 9.5, absolute eosinophil 1000, absolute lymphocyte 2500, hemoglobin 13.3, creatinine 0.86 mg/DL, AST 20 6U/L, ALT 20 6U/L, IgE 8 KU/L, no antigen specific IgE antibodies directed against a area wto aero allergen profile.  Assessment and Plan:   1. Not well controlled severe persistent asthma   2. Perennial allergic rhinitis     1.  Treat and prevent inflammation:   A. Symbicort 160-2 inhalations twice a day with spacer   B. Benralizumab injections  2.  If needed:   A.   ProAir HFA 2 inhalations or nebulization every 4-6 hours  B.  OTC antihistamine - loratadine/ Ceterizine 10 mg -1 tablet 1 time per day  3. Obtain fall flu vaccine and Covid vaccine    4. Return to clinic in December 2021 or earlier if problem  Tracy [name] appears to be doing better but certainly still has a long ways to go to clear up her inflammation and airway mucus production.  Hopefully within the next 3 months her airway will clear and will be able to control her disease state just with  benralizumab.  I will see her back in this clinic in December 2021 or earlier if there is a problem.  [name], MD Allergy / Immunology [facility]

## 2020-01-03 ENCOUNTER — Encounter: Payer: Self-pay | Admitting: Allergy and Immunology

## 2020-01-23 ENCOUNTER — Ambulatory Visit: Payer: Medicaid Other

## 2020-01-24 ENCOUNTER — Ambulatory Visit: Payer: Self-pay

## 2020-01-24 DIAGNOSIS — J4550 Severe persistent asthma, uncomplicated: Secondary | ICD-10-CM

## 2020-01-24 NOTE — Progress Notes (Signed)
Patient came today to receive instruction on how to self-inject the Fasenra Pen.  I demonstrated how to self-inject with demonstrator and had patient practice with demo as well. Patient then self administered Fasenra and waited approximately 30 minutes.  Patient did not have any notable reactions and did have Epipen if needed. Instructed patient that she will now need to call specialty pharmacy for refills.

## 2020-04-19 ENCOUNTER — Ambulatory Visit: Payer: Medicaid Other | Admitting: Allergy and Immunology

## 2020-04-19 ENCOUNTER — Other Ambulatory Visit: Payer: Self-pay

## 2020-04-19 ENCOUNTER — Ambulatory Visit (INDEPENDENT_AMBULATORY_CARE_PROVIDER_SITE_OTHER): Payer: Medicaid Other | Admitting: Allergy and Immunology

## 2020-04-19 VITALS — BP 118/72 | HR 100 | Resp 22 | Ht 61.0 in | Wt 149.0 lb

## 2020-04-19 DIAGNOSIS — J4550 Severe persistent asthma, uncomplicated: Secondary | ICD-10-CM

## 2020-04-19 DIAGNOSIS — J3089 Other allergic rhinitis: Secondary | ICD-10-CM

## 2020-04-19 NOTE — [place] Notes (Signed)
[place] - [place] - [place] - [place] - [place]   Follow-up Note  Referring Provider: No ref. provider found Primary Provider: Patient, No Pcp Per Date of Office Visit: 04/19/2020  Subjective:   Tracy [name] (DOB: February 18, 1996) is a 24 y.o. female who [facility] 04[facility]/2020 in re-evaluation of the following:  HPI: Tracy [name] returns to this clinic in reevaluation of eosinophilic driven asthma and allergic rhinitis.  Her last visit to this clinic was 02 January 2020.  Ever since we started her on benralizumab her asthma has been nonexistent.  Rarely does she use a short acting bronchodilator.  She has tapered her Symbicort to 1 time per day.  She has no issues with her nose.  She has not required a systemic steroid or an antibiotic for any type of airway issue since her last visit.  She will not be receiving the flu vaccine or the Covid vaccine this year.  Allergies as of 04/19/2020   No Known Allergies     Medication List      albuterol 108 (90 Base) MCG/ACT inhaler Commonly known as: ProAir HFA Can inhale two puffs every four to six hours as needed for cough or wheeze.   albuterol (2.5 MG/3ML) 0.083% nebulizer solution Commonly known as: PROVENTIL Take 2.5 mg by nebulization every 6 (six) hours as needed for wheezing or shortness of breath.   cetirizine 10 MG tablet Commonly known as: ZYRTEC Can take one tablet by mouth once daily if needed.   EPINEPHrine 0.3 mg/0.3 mL Soaj injection Commonly known as: EPI-PEN Use as directed for life threatening allergic reaction   Flovent HFA 220 MCG/ACT inhaler Generic drug: fluticasone Inhale 2 puffs into the lungs 2 (two) times daily.   ketoconazole 2 % shampoo Commonly known as: NIZORAL U SHAMPOO QOD UTD   PRENATAL VITAMIN PO Take 1 tablet by mouth daily.   sertraline 100 MG tablet Commonly known as: ZOLOFT take 1.5 tablet by oral route  every day   Symbicort 160-4.5 MCG/ACT  inhaler Generic drug: budesonide-formoterol Inhale two puffs using spacer twice daily to prevent cough or wheeze.  Rinse, gargle, and spit after use.       Past Medical History:  Diagnosis Date  . Abdominal pain   . Anxiety   . Asthma   . Complication of anesthesia    panic attack   . Depression   . IUGR (intrauterine growth restriction) affecting care of mother   . IUGR, antenatal   . Polyhydramnios affecting pregnancy     Past Surgical History:  Procedure Laterality Date  . ADENOIDECTOMY    . CHOLECYSTECTOMY  05/2016  . ESOPHAGOGASTRODUODENOSCOPY N/A 08/12/2013   Procedure: ESOPHAGOGASTRODUODENOSCOPY (EGD);  Surgeon: Jon Gills, MD;  Location: [facility] ENDOSCOPY;  Service: Endoscopy;  Laterality: N/A;  . TYMPANOSTOMY TUBE PLACEMENT      Review of systems negative except as noted in HPI / PMHx or noted below:  Review of Systems  Constitutional: Negative.   HENT: Negative.   Eyes: Negative.   Respiratory: Negative.   Cardiovascular: Negative.   Gastrointestinal: Negative.   Genitourinary: Negative.   Musculoskeletal: Negative.   Skin: Negative.   Neurological: Negative.   Endo/Heme/Allergies: Negative.   Psychiatric/Behavioral: Negative.      Objective:   Vitals:   04/19/20 1607  BP: 118/72  Pulse: 100  Resp: (!) 22  SpO2: 97%   Height: 5\' 1"  (154.9 cm)  Weight: 149 lb 0.5 oz (67.6 kg)  Physical Exam Constitutional:      Appearance: She is not diaphoretic.  HENT:     Head: Normocephalic.     Right Ear: Tympanic membrane, ear canal and external ear normal.     Left Ear: Tympanic membrane, ear canal and external ear normal.     Nose: Nose normal. No mucosal edema or rhinorrhea.     Mouth/Throat:     Mouth: Oropharynx is clear and moist and mucous membranes are normal.     Pharynx: Uvula midline. No oropharyngeal exudate.  Eyes:     Conjunctiva/sclera: Conjunctivae normal.  Neck:     Thyroid: No thyromegaly.     Trachea: Trachea normal. No tracheal  tenderness or tracheal deviation.  Cardiovascular:     Rate and Rhythm: Normal rate and regular rhythm.     Heart sounds: Normal heart sounds, S1 normal and S2 normal. No murmur heard.   Pulmonary:     Effort: No respiratory distress.     Breath sounds: Normal breath sounds. No stridor. No wheezing or rales.  Musculoskeletal:        General: No edema.  Lymphadenopathy:     Head:     Right side of head: No tonsillar adenopathy.     Left side of head: No tonsillar adenopathy.     Cervical: No cervical adenopathy.  Skin:    Findings: No erythema or rash.     Nails: There is no clubbing.  Neurological:     Mental Status: She is alert.     Diagnostics:    Spirometry was performed and demonstrated an FEV1 of 2.89 at 96 % of predicted.  The patient had an Asthma Control Test with the following results: ACT Total Score: 25.    Assessment and Plan:   1. Asthma, severe persistent, well-controlled   2. Perennial allergic rhinitis     1.  Treat and prevent inflammation:   A. Symbicort 160-2 inhalations 1-2 times per day with spacer   B. Benralizumab injections  2.  If needed:   A.   ProAir HFA 2 inhalations or nebulization every 4-6 hours  B.  OTC antihistamine - loratadine/ Cetirizine 10 mg -1 tablet 1 time per day  3. Return to clinic in 6 months or earlier if problem  Tracy [name] is really doing very well ever since we started her on benralizumab and she will continue on this biologic agent at this point and continue on a varying dose of Symbicort appropriate for her disease activity.  I will see her back in this clinic in 6 months or earlier if there is a problem.  Tracy Schimke, MD Allergy / Immunology  [facility]

## 2020-04-19 NOTE — Patient Instructions (Signed)
  1.  Treat and prevent inflammation:   A. Symbicort 160-2 inhalations 1-2 times per day with spacer   B. Benralizumab injections  2.  If needed:   A.   ProAir HFA 2 inhalations or nebulization every 4-6 hours  B.  OTC antihistamine - loratadine/ Cetirizine 10 mg -1 tablet 1 time per day  3. Return to clinic in 6 months or earlier if problem    

## 2020-04-23 ENCOUNTER — Encounter: Payer: Self-pay | Admitting: Allergy and Immunology

## 2020-04-25 ENCOUNTER — Encounter: Payer: Self-pay | Admitting: *Deleted

## 2020-08-01 ENCOUNTER — Other Ambulatory Visit: Payer: Self-pay | Admitting: Allergy and Immunology

## 2020-09-26 ENCOUNTER — Ambulatory Visit (INDEPENDENT_AMBULATORY_CARE_PROVIDER_SITE_OTHER): Payer: Medicaid Other | Admitting: Family Medicine

## 2020-09-26 ENCOUNTER — Encounter: Payer: Self-pay | Admitting: Family Medicine

## 2020-09-26 ENCOUNTER — Other Ambulatory Visit: Payer: Self-pay

## 2020-09-26 VITALS — Wt 152.8 lb

## 2020-09-26 DIAGNOSIS — F3281 Premenstrual dysphoric disorder: Secondary | ICD-10-CM | POA: Diagnosis not present

## 2020-09-26 MED ORDER — NORGESTIMATE-ETH ESTRADIOL 0.25-35 MG-MCG PO TABS: 1.0000 | ORAL_TABLET | Freq: Every day | ORAL | 5 refills | Status: DC

## 2020-09-26 MED ORDER — SERTRALINE HCL 100 MG PO TABS: 150.0000 mg | ORAL_TABLET | Freq: Every day | ORAL | Status: AC

## 2020-09-26 NOTE — Progress Notes (Signed)
   Subjective:    Patient ID: Tracy [name] is a 25 y.o. female presenting with Premenstrual Syndrome  on 09/26/2020  HPI: Has Liletta in place after her last pregnancy. ? Having PMDD. Has mood swings, feels tired and nausea and hot flashes in the 2 weeks prior to her cycle. Has monthly cycles which are still heavy. On zoloft and has not helped with her PMDD. Has psychologist. Had difficult pregnancy and does not want more children at this point.   Review of Systems  Constitutional: Negative for chills and fever.  Respiratory: Negative for shortness of breath.   Cardiovascular: Negative for chest pain.  Gastrointestinal: Negative for abdominal pain, nausea and vomiting.  Genitourinary: Negative for dysuria.  Skin: Negative for rash.      Objective:    Wt 152 lb 12.8 oz (69.3 kg)   BMI 28.87 kg/m  Physical Exam Constitutional:      General: She is not in acute distress.    Appearance: She is well-developed.  HENT:     Head: Normocephalic and atraumatic.  Eyes:     General: No scleral icterus. Cardiovascular:     Rate and Rhythm: Normal rate.  Pulmonary:     Effort: Pulmonary effort is normal.  Abdominal:     Palpations: Abdomen is soft.  Musculoskeletal:     Cervical back: Neck supple.  Skin:    General: Skin is warm and dry.  Neurological:     Mental Status: She is alert and oriented to person, place, and time.         Assessment & Plan:   Problem List Items Addressed This Visit      Unprioritized   PMDD (premenstrual dysphoric disorder) - Primary    Wants BTL--which would not help, plus literature supports regret prior to age 64--discussed at length. This is not a great option. Then asked for Hysterectomy with BSO-->discussed impact on life expectancy, need for hormones and that this would really be malpractice. Will try COC with skipping placebos to avoid hormone fluctuations. Her LMP is middle of may. Will continue Liletta until just before next cycle,  remove then begin COCs Sunday after next menses.      Relevant Medications   sertraline (ZOLOFT) 100 MG tablet   norgestimate-ethinyl estradiol (ORTHO-CYCLEN) 0.25-35 MG-MCG tablet     Return in about 4 weeks (around 10/24/2020) for virtual, a follow-up.  Reva Bores 09/26/2020 9:59 AM

## 2020-09-26 NOTE — Patient Instructions (Addendum)
Premenstrual Syndrome Premenstrual syndrome (PMS) is a group of physical, emotional, and behavioral symptoms that affect women as part of their menstrual cycle. PMS occurs 1-2 weeks before the start of a woman's menstrual period and goes away a few days after menstrual bleeding begins. PMS can range from mild to severe. What are the causes? The exact cause of this condition is not known, but it seems to be related to hormone changes that happen before menstruation. What are the signs or symptoms? Symptoms of this condition often happen every month. They go away after your period starts. Physical symptoms of this condition include:  Bloating.  Breast pain or tenderness.  Headaches.  Extreme fatigue.  Backaches.  Swelling of the hands and feet.  Weight gain.  Hot flashes. Emotional symptoms of this condition include:  Mood swings.  Depression.  Angry or hostile outbursts.  Irritability.  Anxiety.  Crying spells. Behavioral symptoms include:  Food cravings or appetite changes.  Changes in sexual desire.  Confusion.  Social withdrawal.  Poor concentration. How is this diagnosed? This condition may be diagnosed based on a history of your symptoms. This condition is generally diagnosed if symptoms of PMS:  Are present in the 5 days before your period starts.  End within 4 days after your period starts.  Happen at least 3 months in a row.  Interfere with some of your normal activities. Other conditions that can cause some of these symptoms must be ruled out before PMS can be diagnosed. These include depression, anxiety, anemia, and thyroid problems. How is this treated? This condition may be treated by doing the following:  Maintaining a healthy lifestyle. This includes eating a well-balanced diet and exercising regularly.  Taking over-the-counter medicines that can help relieve symptoms, such as cramps, aches, pain, headaches, and breast tenderness. Follow  these instructions at home: Eating and drinking  Eat a well-balanced diet.  Avoid caffeine and alcohol.  Limit the amount of salt and salty foods you eat. This will help reduce bloating.  Drink enough fluid to keep your urine pale yellow.  Take a multivitamin if told to do so by your health care provider.   Lifestyle  Do not use any products that contain nicotine or tobacco. These products include cigarettes, chewing tobacco, and vaping devices, such as e-cigarettes. If you need help quitting, ask your health care provider.  Exercise regularly as suggested by your health care provider.  Get enough sleep. For most adults, this is 7-8 hours of sleep each night.  Practice relaxation techniques, such as yoga, tai chi, or meditation.  Find healthy ways to manage stress.   General instructions  For 2-3 months, write down your symptoms, whether they are mild to severe, and how long they last. This will help your health care provider choose the best treatment for you.  Take over-the-counter and prescription medicines only as told by your health care provider.  If you are using birth control pills (oral contraceptives), use them as told by your health care provider.   Contact a health care provider if:  Your symptoms get worse.  You develop new symptoms.  You have trouble doing your daily activities. Summary  Premenstrual syndrome (PMS) is a group of physical, emotional, and behavioral symptoms that affect women as part of their menstrual cycle.  PMS starts 1-2 weeks before the start of a woman's period and goes away a few days after the period starts.  PMS is treated by maintaining a healthy lifestyle and taking medicines   to relieve the symptoms. This information is not intended to replace advice given to you by your health care provider. Make sure you discuss any questions you have with your health care provider. Document Revised: 12/09/2019 Document Reviewed:  12/09/2019 Elsevier Patient Education  2021 [employer].

## 2020-09-26 NOTE — Assessment & Plan Note (Signed)
Wants BTL--which would not help, plus literature supports regret prior to age 25--discussed at length. This is not a great option. Then asked for Hysterectomy with BSO-->discussed impact on life expectancy, need for hormones and that this would really be malpractice. Will try COC with skipping placebos to avoid hormone fluctuations. Her LMP is middle of may. Will continue Liletta until just before next cycle, remove then begin COCs Sunday after next menses.

## 2020-10-15 ENCOUNTER — Other Ambulatory Visit: Payer: Self-pay

## 2020-10-15 ENCOUNTER — Ambulatory Visit (INDEPENDENT_AMBULATORY_CARE_PROVIDER_SITE_OTHER): Payer: Medicaid Other | Admitting: Allergy and Immunology

## 2020-10-15 VITALS — BP 120/64 | HR 80 | Resp 16 | Ht 61.5 in | Wt 156.0 lb

## 2020-10-15 DIAGNOSIS — J4550 Severe persistent asthma, uncomplicated: Secondary | ICD-10-CM | POA: Diagnosis not present

## 2020-10-15 DIAGNOSIS — J3089 Other allergic rhinitis: Secondary | ICD-10-CM

## 2020-10-15 MED ORDER — PROAIR HFA 108 (90 BASE) MCG/ACT IN AERS: INHALATION_SPRAY | RESPIRATORY_TRACT | 1 refills | Status: DC

## 2020-10-15 MED ORDER — SYMBICORT 160-4.5 MCG/ACT IN AERO: INHALATION_SPRAY | RESPIRATORY_TRACT | 5 refills | Status: DC

## 2020-10-15 NOTE — Progress Notes (Signed)
[place] - [place] - [place] - [place] - [place]   Follow-up Note  Referring Provider: No ref. provider found Primary Provider: Patient, No Pcp Per (Inactive) Date of Office Visit: 10/15/2020  Subjective:   Tracy [name] (DOB: 1996-04-18) is a 25 y.o. female who [facility] in re-evaluation of the following:  HPI: Tracy [name] returns to this clinic in evaluation of eosinophilic driven asthma and allergic rhinitis.  Her last visit to this clinic was 19 April 2020.  Once again she has really done very well while utilizing benralizumab and a low dose of Symbicort regarding control of her asthma.  She has not required a systemic steroid nor does she use a short acting bronchodilator greater than 1 time per week and she can exert herself without any problem and she has had absolutely no problems with her nose.  She was infected with COVID in January 2022 and had a very mild illness with no long-term sequela.   Allergies as of 10/15/2020   No Known Allergies      Medication List       ProAir HFA 108 (90 Base) MCG/ACT inhaler Generic drug: albuterol INHALE 2 PUFFS BY MOUTH EVERY 4 TO 6 HOURS AS NEEDED FOR COUGH OR WHEEZING   albuterol (2.5 MG/3ML) 0.083% nebulizer solution Commonly known as: PROVENTIL Take 2.5 mg by nebulization every 6 (six) hours as needed for wheezing or shortness of breath.   EPINEPHrine 0.3 mg/0.3 mL Soaj injection Commonly known as: EPI-PEN Use as directed for life threatening allergic reaction   ketoconazole 2 % shampoo Commonly known as: NIZORAL U SHAMPOO QOD UTD   Mirena (52 MG) 20 MCG/DAY Iud Generic drug: levonorgestrel   norgestimate-ethinyl estradiol 0.25-35 MG-MCG tablet Commonly known as: ORTHO-CYCLEN Take 1 tablet by mouth daily. Skip placebos   sertraline 100 MG tablet Commonly known as: Zoloft Take 1.5 tablets (150 mg total) by mouth daily.   Symbicort 160-4.5 MCG/ACT  inhaler Generic drug: budesonide-formoterol Inhale two puffs using spacer twice daily to prevent cough or wheeze.  Rinse, gargle, and spit after use.        Past Medical History:  Diagnosis Date   Abdominal pain    Anxiety    Asthma    Complication of anesthesia    panic attack    Depression    IUGR (intrauterine growth restriction) affecting care of mother    IUGR, antenatal    Polyhydramnios affecting pregnancy     Past Surgical History:  Procedure Laterality Date   ADENOIDECTOMY     CHOLECYSTECTOMY  05/2016   ESOPHAGOGASTRODUODENOSCOPY N/A 08/12/2013   Procedure: ESOPHAGOGASTRODUODENOSCOPY (EGD);  Surgeon: Jon Gills, MD;  Location: [facility] ENDOSCOPY;  Service: Endoscopy;  Laterality: N/A;   TYMPANOSTOMY TUBE PLACEMENT      Review of systems negative except as noted in HPI / PMHx or noted below:  Review of Systems  Constitutional: Negative.   HENT: Negative.    Eyes: Negative.   Respiratory: Negative.    Cardiovascular: Negative.   Gastrointestinal: Negative.   Genitourinary: Negative.   Musculoskeletal: Negative.   Skin: Negative.   Neurological: Negative.   Endo/Heme/Allergies: Negative.   Psychiatric/Behavioral: Negative.      Objective:   Vitals:   10/15/20 1515  BP: 120/64  Pulse: 80  Resp: 16  SpO2: 98%   Height: 5' 1.5" (156.2 cm)  Weight: 156 lb (70.8 kg)   Physical Exam Constitutional:      Appearance: She is  not diaphoretic.  HENT:     Head: Normocephalic.     Right Ear: Tympanic membrane, ear canal and external ear normal.     Left Ear: Tympanic membrane, ear canal and external ear normal.     Nose: Nose normal. No mucosal edema or rhinorrhea.     Mouth/Throat:     Pharynx: Uvula midline. No oropharyngeal exudate.  Eyes:     Conjunctiva/sclera: Conjunctivae normal.  Neck:     Thyroid: No thyromegaly.     Trachea: Trachea normal. No tracheal tenderness or tracheal deviation.  Cardiovascular:     Rate and Rhythm: Normal rate and  regular rhythm.     Heart sounds: Normal heart sounds, S1 normal and S2 normal. No murmur heard. Pulmonary:     Effort: No respiratory distress.     Breath sounds: Normal breath sounds. No stridor. No wheezing or rales.  Lymphadenopathy:     Head:     Right side of head: No tonsillar adenopathy.     Left side of head: No tonsillar adenopathy.     Cervical: No cervical adenopathy.  Skin:    Findings: No erythema or rash.     Nails: There is no clubbing.  Neurological:     Mental Status: She is alert.    Diagnostics:    Spirometry was performed and demonstrated an FEV1 of 2.49 at 81 % of predicted.  Assessment and Plan:   1. Asthma, severe persistent, well-controlled   2. Perennial allergic rhinitis     1.  Treat and prevent inflammation:   A. Symbicort 160-2 inhalations 1-2 times per day with spacer   B. Benralizumab injections  2.  If needed:   A.   ProAir HFA 2 inhalations or nebulization every 4-6 hours  B.  OTC antihistamine - loratadine/ Cetirizine 10 mg -1 tablet 1 time per day  3. Return to clinic in 6 months or earlier if problem   Jamiyah is really doing very well and she will continue to use her benralizumab injections to control her asthma and as well her upper airway disease and also continue on a relatively low dose of Symbicort.  I will see her back in this clinic in 6 months or earlier if there is a problem.  [name], MD Allergy / Immunology [facility]

## 2020-10-15 NOTE — Patient Instructions (Signed)
  1.  Treat and prevent inflammation:   A. Symbicort 160-2 inhalations 1-2 times per day with spacer   B. Benralizumab injections  2.  If needed:   A.   ProAir HFA 2 inhalations or nebulization every 4-6 hours  B.  OTC antihistamine - loratadine/ Cetirizine 10 mg -1 tablet 1 time per day  3. Return to clinic in 6 months or earlier if problem

## 2020-10-16 ENCOUNTER — Encounter: Payer: Self-pay | Admitting: Allergy and Immunology

## 2020-11-25 ENCOUNTER — Other Ambulatory Visit: Payer: Self-pay | Admitting: Allergy and Immunology

## 2020-11-26 NOTE — Telephone Encounter (Signed)
Please advise 

## 2021-01-03 ENCOUNTER — Telehealth: Payer: Self-pay | Admitting: Allergy and Immunology

## 2021-01-03 MED ORDER — PROAIR HFA 108 (90 BASE) MCG/ACT IN AERS: INHALATION_SPRAY | RESPIRATORY_TRACT | 1 refills | Status: DC

## 2021-01-03 NOTE — Telephone Encounter (Signed)
Patient was informed.

## 2021-01-03 NOTE — Telephone Encounter (Signed)
Refill for ProAir has been sent in to [facility] in [place].

## 2021-01-03 NOTE — Telephone Encounter (Signed)
Patient is requesting a refill on her Proair inhaler.   Best pharmacy- [facility] in [place]

## 2021-01-09 MED ORDER — PROAIR HFA 108 (90 BASE) MCG/ACT IN AERS: INHALATION_SPRAY | RESPIRATORY_TRACT | 1 refills | Status: DC

## 2021-01-09 NOTE — Telephone Encounter (Signed)
ProAir sent to [facility] in [place]. Patient was informed and verbalized understanding

## 2021-01-09 NOTE — Addendum Note (Signed)
Addended by: Deborra Medina on: 01/09/2021 11:41 AM   Modules accepted: Orders

## 2021-01-09 NOTE — Telephone Encounter (Signed)
Patient called the office back and requested to have the Proair inhaler sent to [facility] in [place] instead of [facility].

## 2021-02-04 ENCOUNTER — Other Ambulatory Visit: Payer: Self-pay | Admitting: Allergy and Immunology

## 2021-04-09 ENCOUNTER — Other Ambulatory Visit: Payer: Self-pay | Admitting: *Deleted

## 2021-04-09 MED ORDER — VENTOLIN HFA 108 (90 BASE) MCG/ACT IN AERS: INHALATION_SPRAY | RESPIRATORY_TRACT | 0 refills | Status: DC

## 2021-04-15 ENCOUNTER — Encounter: Payer: Self-pay | Admitting: Allergy and Immunology

## 2021-04-15 ENCOUNTER — Ambulatory Visit (INDEPENDENT_AMBULATORY_CARE_PROVIDER_SITE_OTHER): Payer: Medicaid Other | Admitting: Allergy and Immunology

## 2021-04-15 ENCOUNTER — Other Ambulatory Visit: Payer: Self-pay

## 2021-04-15 ENCOUNTER — Ambulatory Visit: Payer: Medicaid Other | Admitting: Allergy and Immunology

## 2021-04-15 VITALS — BP 122/68 | HR 84 | Resp 16

## 2021-04-15 DIAGNOSIS — J3089 Other allergic rhinitis: Secondary | ICD-10-CM

## 2021-04-15 DIAGNOSIS — J4550 Severe persistent asthma, uncomplicated: Secondary | ICD-10-CM | POA: Diagnosis not present

## 2021-04-15 MED ORDER — VENTOLIN HFA 108 (90 BASE) MCG/ACT IN AERS: INHALATION_SPRAY | RESPIRATORY_TRACT | 1 refills | Status: DC

## 2021-04-15 NOTE — Patient Instructions (Addendum)
  1.  Treat and prevent inflammation:   A. Benralizumab injections  2.  If needed:   A.  Symbicort 160-2 inhalations 1-2 times per day with spacer  B.  ProAir HFA 2 inhalations or nebulization every 4-6 hours  C.  OTC antihistamine - loratadine/ Cetirizine 10 mg -1 tablet 1 time per day    3. Return to clinic in 12 months or earlier if problem

## 2021-04-15 NOTE — Progress Notes (Signed)
[place] - [place] - [place] - [place] - [place]   Follow-up Note  Referring Provider: No ref. provider found Primary Provider: Patient, No Pcp Per (Inactive) Date of Office Visit: 04/15/2021  Subjective:   Tracy [name] (DOB: 15-Jun-1995) is a 25 y.o. female who [facility] 04[facility]/2021 in re-evaluation of the following:  HPI: Berma presents to this clinic in evaluation of severe eosinophilic driven asthma and allergic rhinitis.  Her last visit to this clinic was 15 October 2020.  She continues to use her benralizumab injections and has had excellent response regarding control of her asthma while doing so.  She intermittently uses Symbicort averaging out to just a few times per week which is similar to her use of albuterol.  She has not required a systemic steroid or antibiotic for any type of airway issue.  She has had no problems with her nose.  She has not received a COVID-vaccine or flu vaccine and will not be doing so this year.  Allergies as of 04/15/2021   No Known Allergies      Medication List    Ventolin HFA 108 (90 Base) MCG/ACT inhaler Generic drug: albuterol Inhale two puffs every 4-6 hours if needed for cough or wheeze.   albuterol (2.5 MG/3ML) 0.083% nebulizer solution Commonly known as: PROVENTIL Take 2.5 mg by nebulization every 6 (six) hours as needed for wheezing or shortness of breath.   EPINEPHrine 0.3 mg/0.3 mL Soaj injection Commonly known as: EPI-PEN Use as directed for life threatening allergic reaction   Fasenra Pen 30 MG/ML Soaj Generic drug: Benralizumab INJECT 1 PEN (30 MG) UNDER THE SKIN AT WEEK 8 THEN EVERY 8 WEEKS THEREAFTER   ketoconazole 2 % shampoo Commonly known as: NIZORAL U SHAMPOO QOD UTD   Mirena (52 MG) 20 MCG/DAY Iud Generic drug: levonorgestrel   norgestimate-ethinyl estradiol 0.25-35 MG-MCG tablet Commonly known as: ORTHO-CYCLEN Take 1 tablet by mouth daily. Skip placebos    sertraline 100 MG tablet Commonly known as: Zoloft Take 1.5 tablets (150 mg total) by mouth daily.   Symbicort 160-4.5 MCG/ACT inhaler Generic drug: budesonide-formoterol Inhale two puffs using spacer one to two times daily to prevent cough or wheeze.  Rinse, gargle, and spit after use.    Past Medical History:  Diagnosis Date   Abdominal pain    Anxiety    Asthma    Complication of anesthesia    panic attack    Depression    IUGR (intrauterine growth restriction) affecting care of mother    IUGR, antenatal    Polyhydramnios affecting pregnancy     Past Surgical History:  Procedure Laterality Date   ADENOIDECTOMY     CHOLECYSTECTOMY  05/2016   ESOPHAGOGASTRODUODENOSCOPY N/A 08/12/2013   Procedure: ESOPHAGOGASTRODUODENOSCOPY (EGD);  Surgeon: Jon Gills, MD;  Location: [facility] ENDOSCOPY;  Service: Endoscopy;  Laterality: N/A;   TYMPANOSTOMY TUBE PLACEMENT      Review of systems negative except as noted in HPI / PMHx or noted below:  Review of Systems  Constitutional: Negative.   HENT: Negative.    Eyes: Negative.   Respiratory: Negative.    Cardiovascular: Negative.   Gastrointestinal: Negative.   Genitourinary: Negative.   Musculoskeletal: Negative.   Skin: Negative.   Neurological: Negative.   Endo/Heme/Allergies: Negative.   Psychiatric/Behavioral: Negative.      Objective:   Vitals:   04/15/21 1101  BP: 122/68  Pulse: 84  Resp: 16  SpO2: 97%  Physical Exam Constitutional:      Appearance: She is not diaphoretic.  HENT:     Head: Normocephalic.     Right Ear: Tympanic membrane, ear canal and external ear normal.     Left Ear: Tympanic membrane, ear canal and external ear normal.     Nose: Nose normal. No mucosal edema or rhinorrhea.     Mouth/Throat:     Pharynx: Uvula midline. No oropharyngeal exudate.  Eyes:     Conjunctiva/sclera: Conjunctivae normal.  Neck:     Thyroid: No thyromegaly.     Trachea: Trachea normal. No tracheal  tenderness or tracheal deviation.  Cardiovascular:     Rate and Rhythm: Normal rate and regular rhythm.     Heart sounds: Normal heart sounds, S1 normal and S2 normal. No murmur heard. Pulmonary:     Effort: No respiratory distress.     Breath sounds: Normal breath sounds. No stridor. No wheezing or rales.  Lymphadenopathy:     Head:     Right side of head: No tonsillar adenopathy.     Left side of head: No tonsillar adenopathy.     Cervical: No cervical adenopathy.  Skin:    Findings: No erythema or rash.     Nails: There is no clubbing.  Neurological:     Mental Status: She is alert.    Diagnostics:    Spirometry was performed and demonstrated an FEV1 of 2.68 at 87 % of predicted.  Assessment and Plan:   1. Asthma, severe persistent, well-controlled   2. Perennial allergic rhinitis     1.  Treat and prevent inflammation:   A. Benralizumab injections  2.  If needed:   A.  Symbicort 160-2 inhalations 1-2 times per day with spacer  B.  ProAir HFA 2 inhalations or nebulization every 4-6 hours  C.  OTC antihistamine - loratadine/ Cetirizine 10 mg -1 tablet 1 time per day    3. Return to clinic in 12 months or earlier if problem  Relda is really doing very well on her current plan of anti-inflammatory agent use including the use of benralizumab injections to control her eosinophilic driven airway disease.  In fact, this agent is working so well that she really does not require any additional medications on a pretty consistent basis.  She has a good understanding of her disease state and how her medications work and appropriate dosing of her medications depending on disease activity and we will see her back in this clinic in 1 year while she continues to use the plan noted above.  [name], MD Allergy / Immunology [facility]

## 2021-04-16 ENCOUNTER — Encounter: Payer: Self-pay | Admitting: Allergy and Immunology

## 2021-08-13 ENCOUNTER — Other Ambulatory Visit: Payer: Self-pay | Admitting: *Deleted

## 2021-08-13 ENCOUNTER — Telehealth: Payer: Self-pay | Admitting: Allergy and Immunology

## 2021-08-13 MED ORDER — VENTOLIN HFA 108 (90 BASE) MCG/ACT IN AERS: INHALATION_SPRAY | RESPIRATORY_TRACT | 1 refills | Status: DC

## 2021-08-13 NOTE — Telephone Encounter (Signed)
Patient is requesting a refill on Ventolin sent to [facility] in [place]  ?

## 2021-08-13 NOTE — Telephone Encounter (Signed)
RX sent

## 2021-08-19 ENCOUNTER — Telehealth: Payer: Self-pay | Admitting: *Deleted

## 2021-08-19 NOTE — Telephone Encounter (Signed)
Called patient and advised approval with new Ins for Fasenra and Rx, Ins to [facility] ?

## 2021-10-26 IMAGING — US US MFM OB FOLLOW-UP
1 series · 14 of 28 positions shown · non-contrast
Comparison: none

[Series 1: us mfm ob follow-up · 47 acquisitions, 14 frames shown]
[im 2/47]
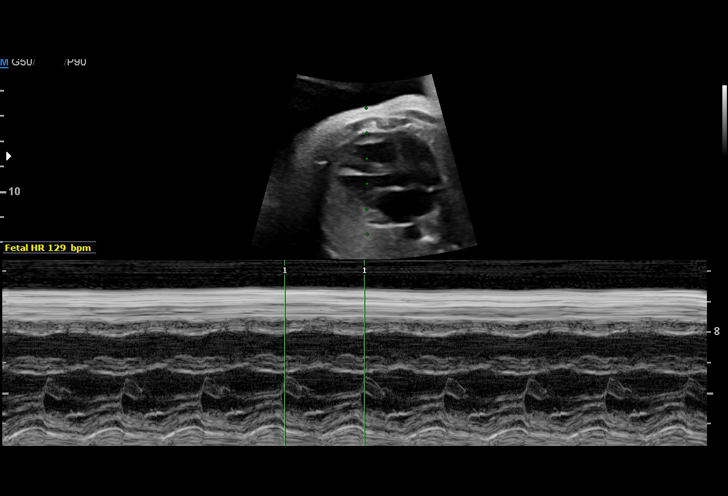
[im 6/47]
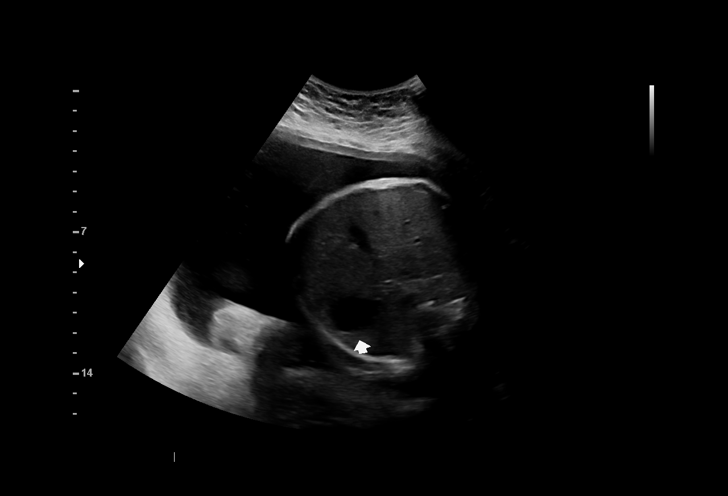
[im 9/47]
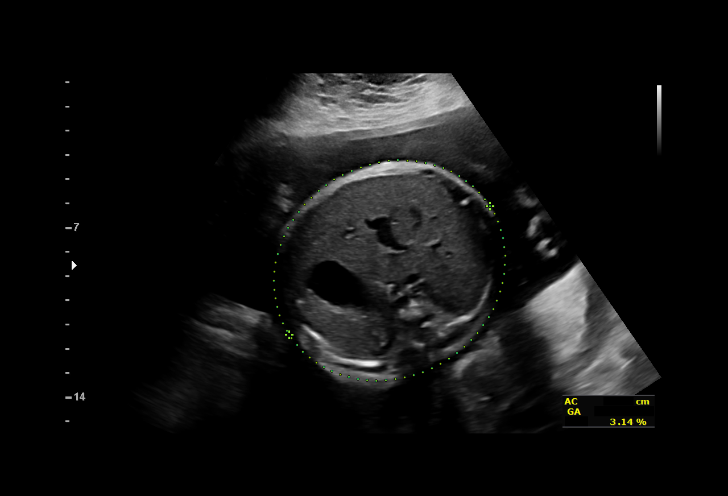
[im 12/47]
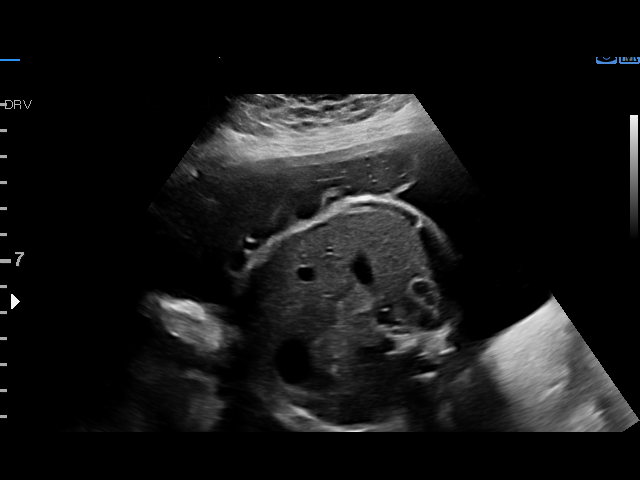
[im 16/47]
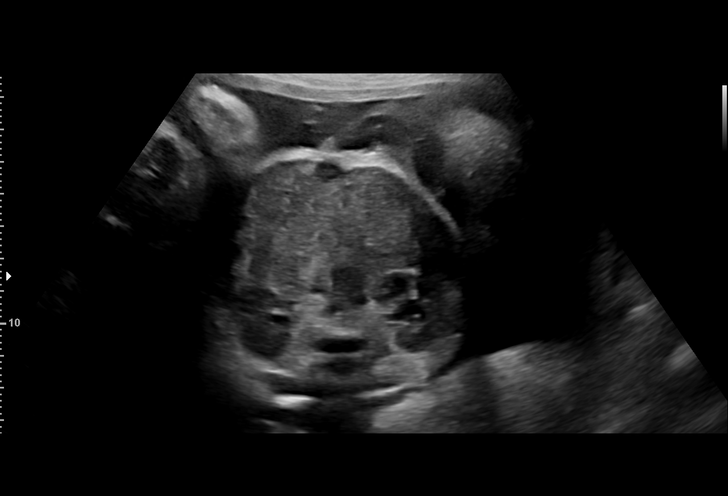
[im 19/47]
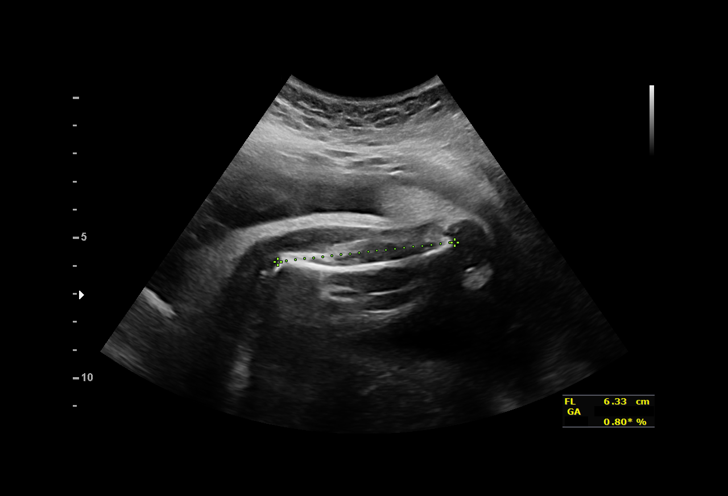
[im 23/47]
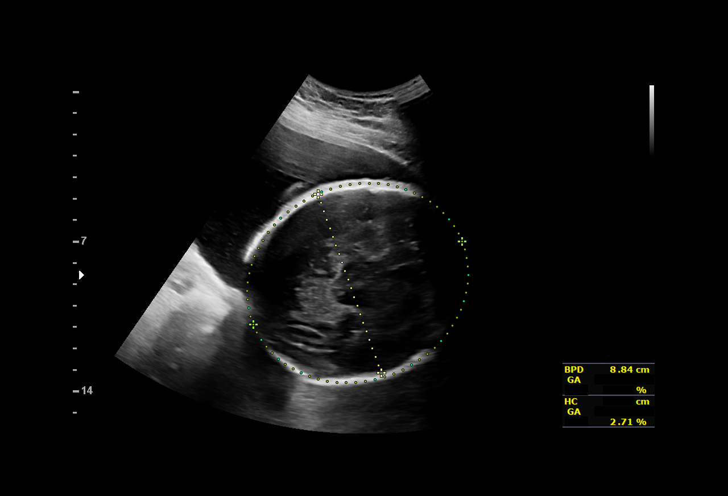
[im 26/47]
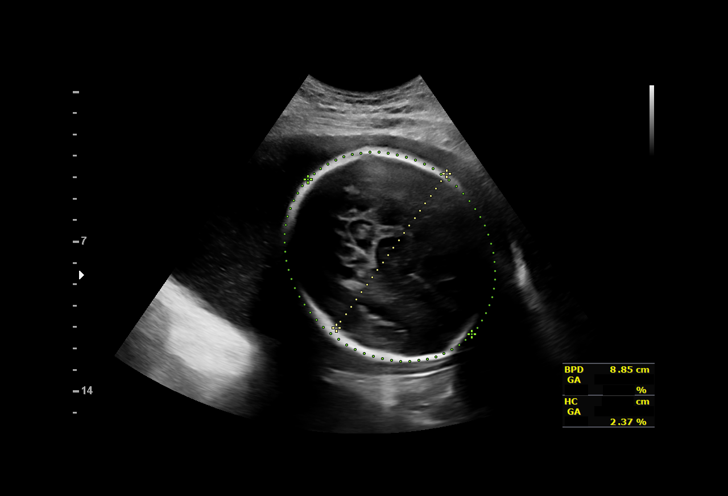
[im 29/47]
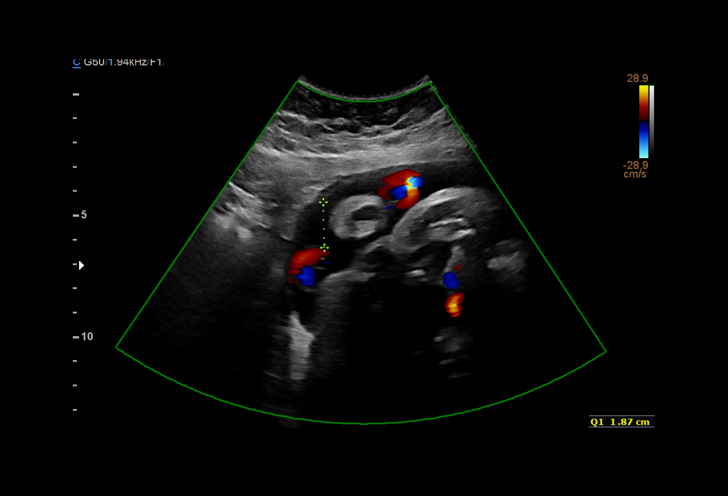
[im 33/47]
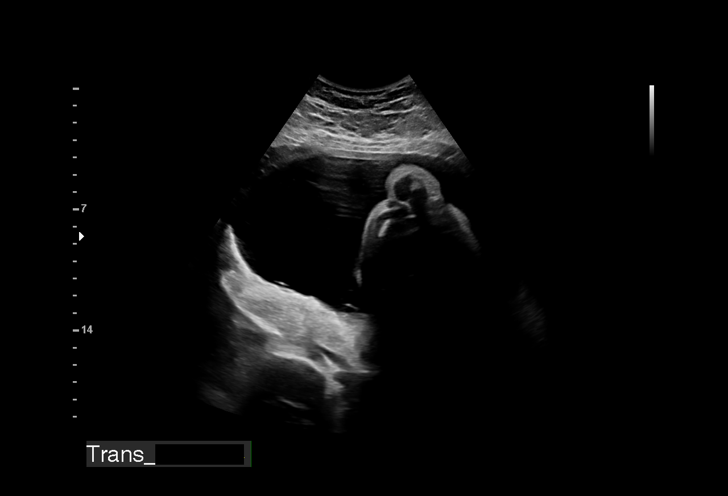
[im 36/47]
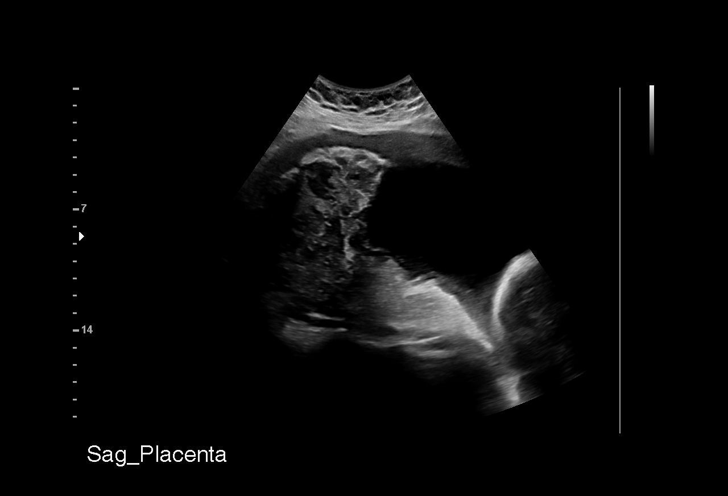
[im 40/47]
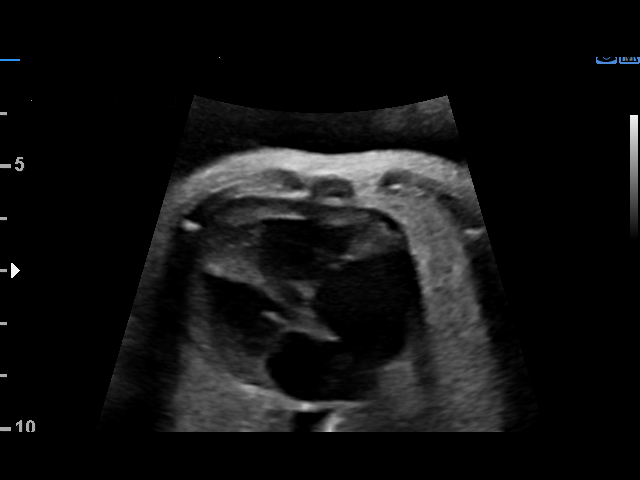
[im 43/47]
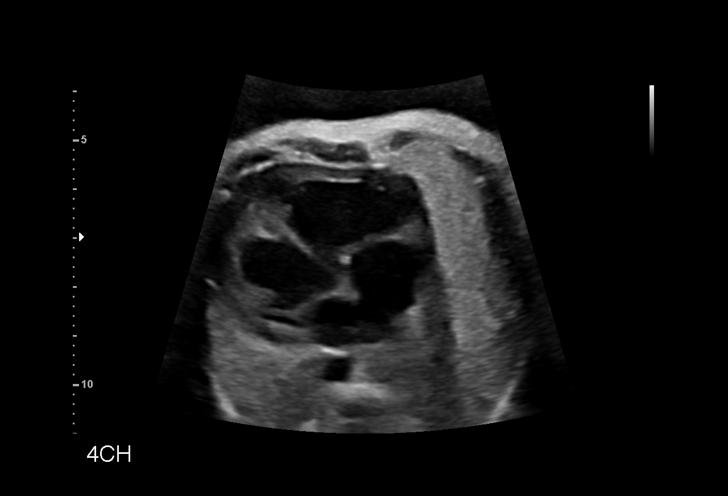
[im 47/47]
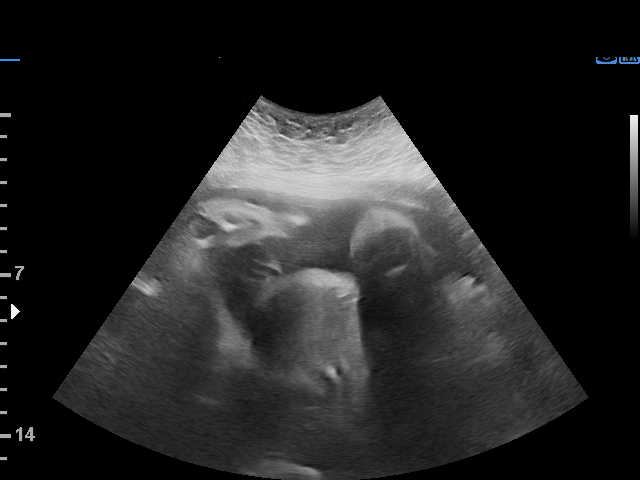

[14 of 28 positions shown; findings below may reference images not displayed]

Indications

 Small for gestational age fetus affecting
 management of mother
 36 weeks gestation of pregnancy
 Obesity complicating pregnancy, third trimester
 Marijuana Use
 Other mental disorder complicating pregnancy,
 third trimester
 Encounter for other antenatal screening
 follow-up
Fetal Evaluation

 Num Of Fetuses:          1
 Fetal Heart Rate(bpm):   129
 Cardiac Activity:        Observed
 Presentation:            Cephalic
 Placenta:                Posterior
 P. Cord Insertion:       Previously Visualized

 Amniotic Fluid
 AFI FV:      Within normal limits

 AFI Sum(cm)     %Tile       Largest Pocket(cm)
 19.3            72
 RUQ(cm)       RLQ(cm)        LUQ(cm)        LLQ(cm)

Biophysical Evaluation

 Amniotic F.V:   Within normal limits        F. Tone:         Observed
 F. Movement:    Observed                    Score:           [DATE]
 F. Breathing:   Observed
Biometry

 BPD:      89.1   mm     G. Age:  36w 0d         60  %    CI:         82.67  %    70 - 86
                                                          FL/HC:       20.4  %    20.1 -
 HC:      309.1   mm     G. Age:  34w 4d          3  %    HC/AC:       1.04       0.93 -
 AC:        297   mm     G. Age:  33w 5d          7  %    FL/BPD:      70.7  %    71 - 87
 FL:         63   mm     G. Age:  32w 4d        < 1  %    FL/AC:       21.2  %    20 - 24
 HUM:      54.1   mm     G. Age:  31w 3d        < 5  %

 LV:         6.6  mm

 Est. FW:    2212   gm    4 lb 15 oz       6  %
OB History

 Gravidity:     1
Gestational Age

 Clinical EDD:   36w 0d                                         EDD:  10/18/19
 U/S Today:      34w 2d                                         EDD:  10/30/19
 Best:           36w 0d    Det. By:  Clinical EDD               EDD:  10/18/19
Anatomy

 Cranium:                Appears normal         LVOT:                   Previously seen
 Cavum:                  Previously seen        Aortic Arch:            Previously seen
 Ventricles:             Appears normal         Ductal Arch:            Previously seen
 Choroid Plexus:         Previously seen        Diaphragm:              Previously seen
 Cerebellum:             Previously seen        Stomach:                Appears normal, left
                                                                        sided
 Posterior Fossa:        Previously seen        Abdomen:                Previously seen
 Nuchal Fold:            Not applicable (>20    Abdominal Wall:         Previously seen
                         wks GA)
 Face:                   Orbits and profile     Cord Vessels:           Previously seen
                         previously seen
 Lips:                   Previously seen        Kidneys:                Appear normal
 Palate:                 Previously seen        Bladder:                Appears normal
 Thoracic:               Appears normal         Spine:                  Limited views
                                                                        appear nl prev seen
 Heart:                  Appears normal         Upper Extremities:      Previously seen
                         (4CH, axis, and situs)
 RVOT:                   Previously seen        Lower Extremities:      Previously seen

 Other:   Male gender previously seen. Nasal bone visualized previously. Heels
          and 5th digit visualized previously.
Doppler - Fetal Vessels
 Umbilical Artery
   S/D     %tile                                              ADFV    RDFV
  2.79        74                                                 No      No

Cervix Uterus Adnexa

 Cervix
 Not visualized (advanced GA >80wks)
Impression

 Follow up growth given SGA
 Good interval growth however, EFW 6th% with normal amniotic
 fluid and UA Dopplers
Recommendations

 Continue weekly testing with UA Dopplers.
 Delivery by 39 weeks.

## 2021-11-03 IMAGING — US US MFM FETAL BPP W/O NON-STRESS
1 series · 12 of 28 positions shown · non-contrast
Comparison: none

[Series 1: us mfm fetal bpp w/o non-stress · 41 acquisitions, 12 frames shown]
[im 2/41]
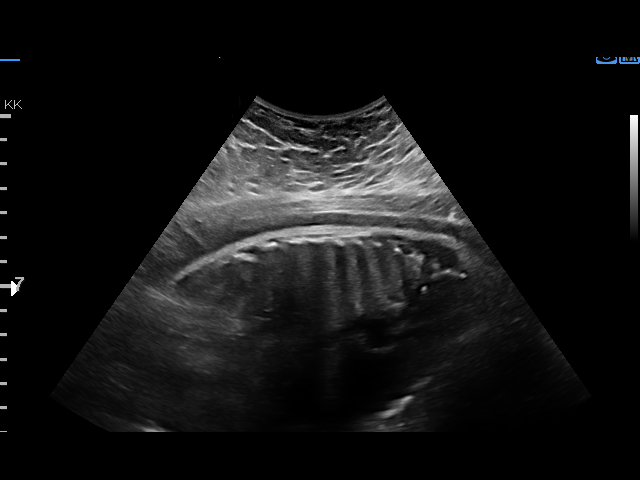
[im 5/41]
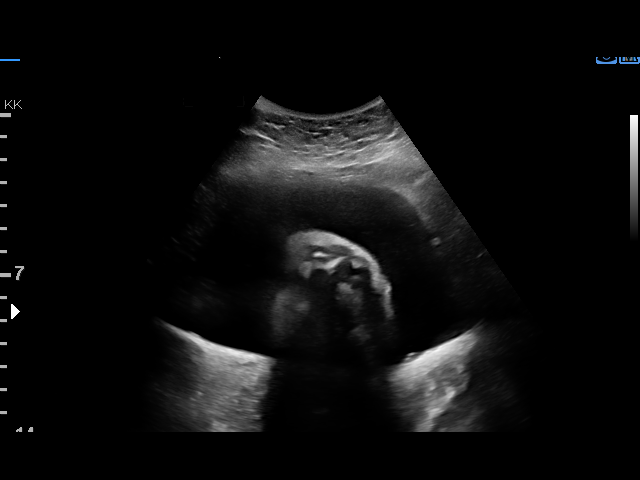
[im 8/41]
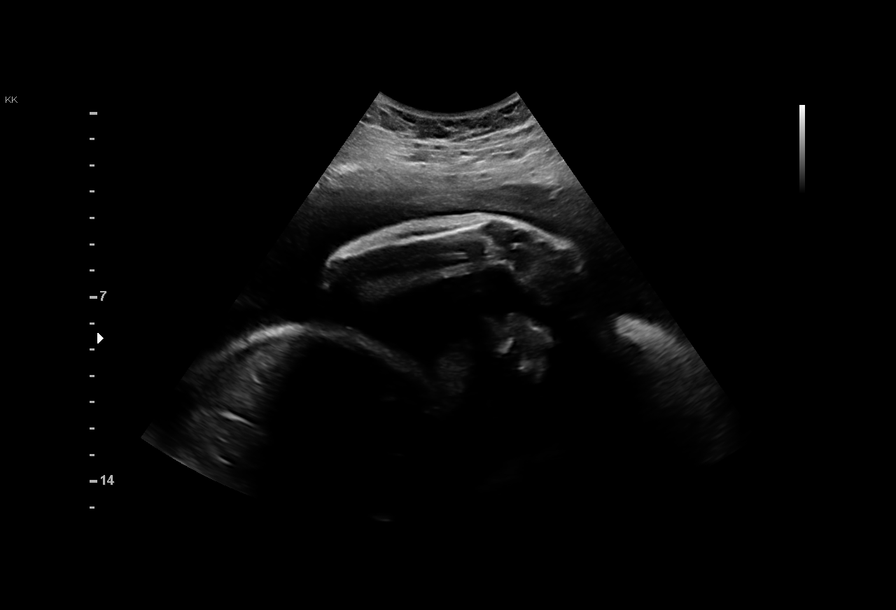
[im 12/41]
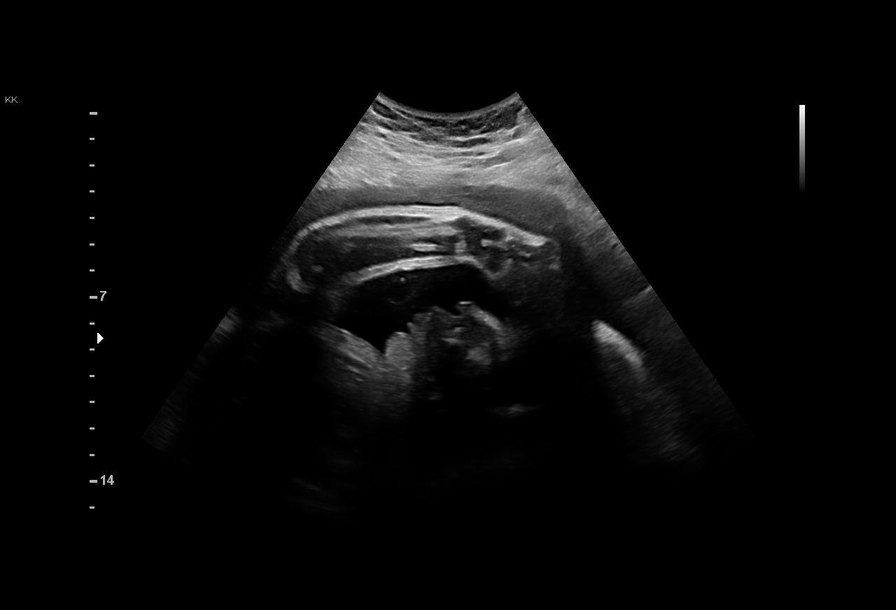
[im 15/41]
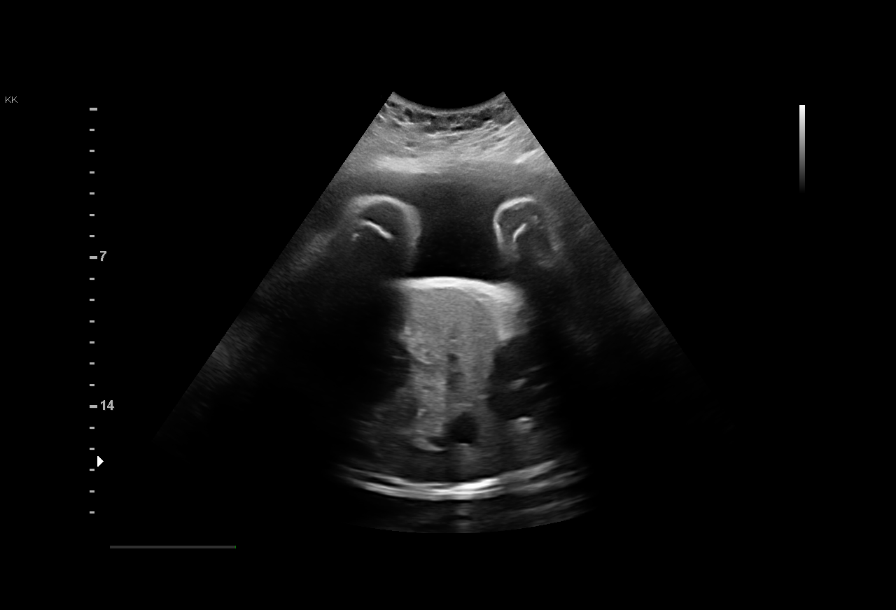
[im 18/41]
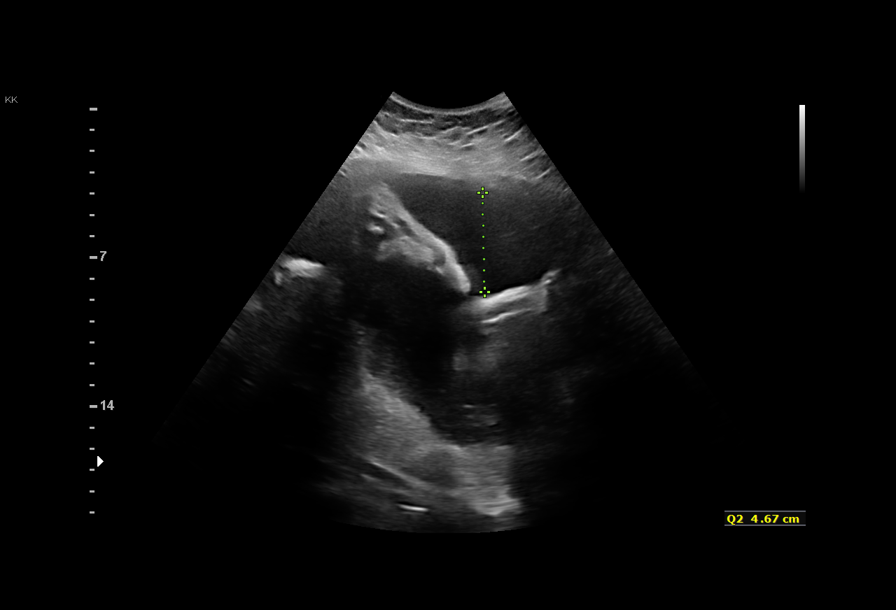
[im 23/41]
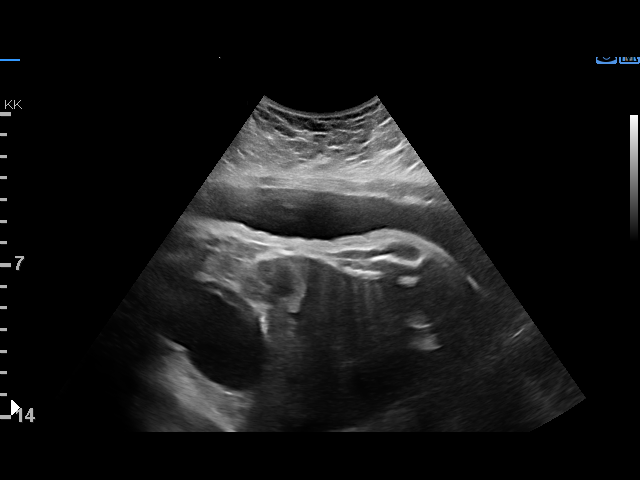
[im 26/41]
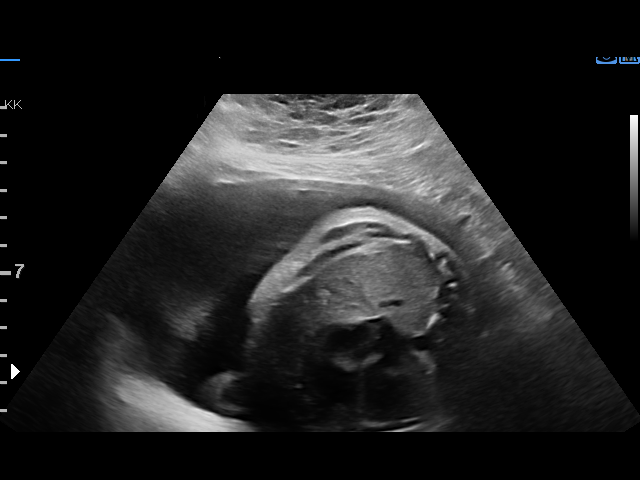
[im 29/41]
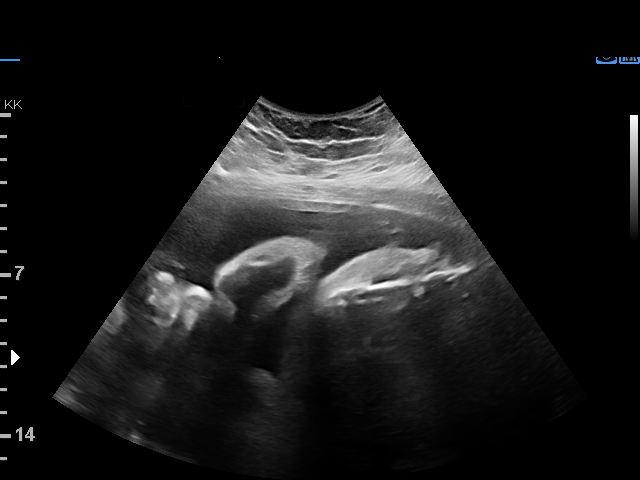
[im 33/41]
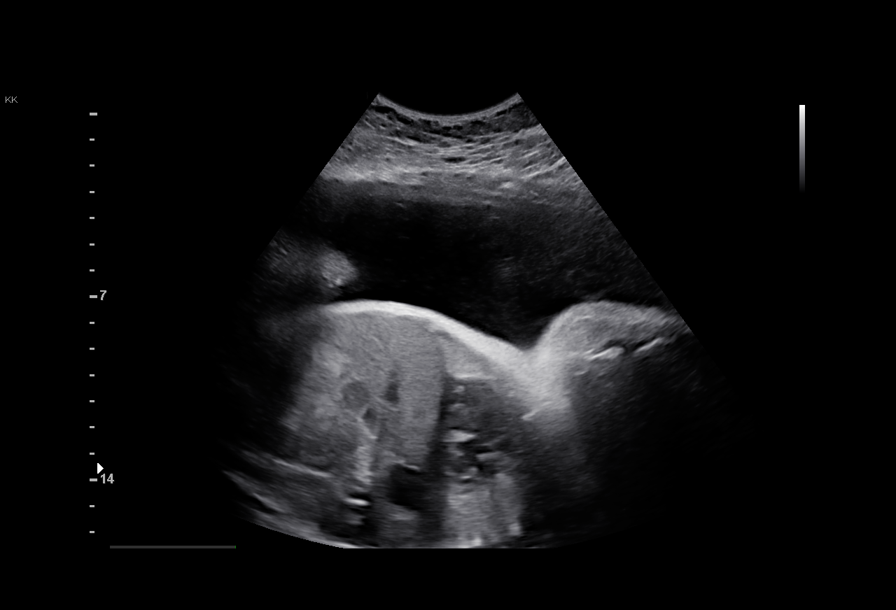
[im 36/41]
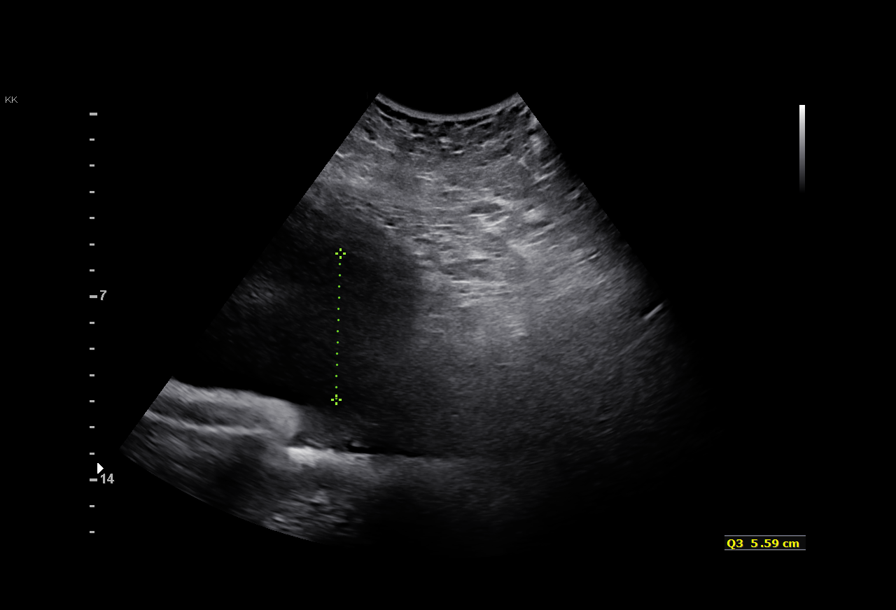
[im 39/41]
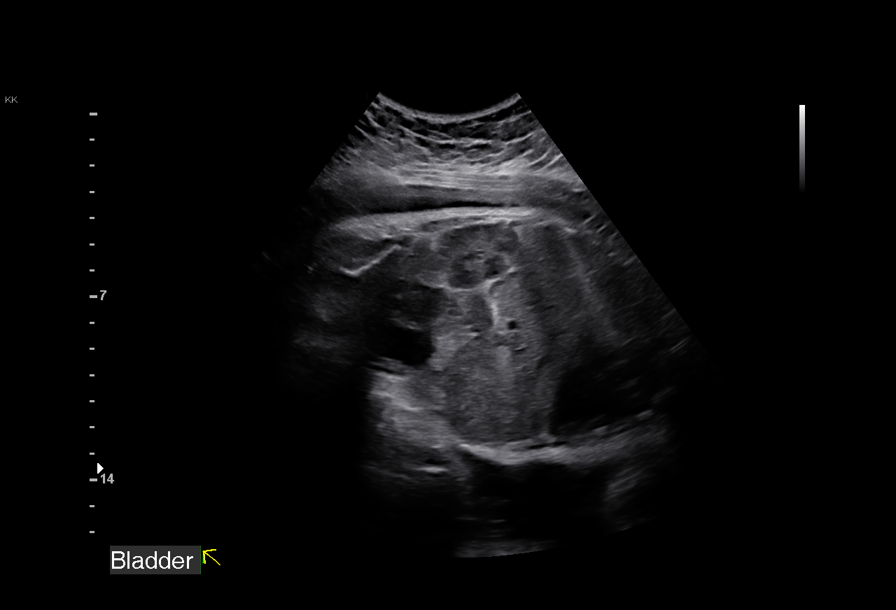

[12 of 28 positions shown; findings below may reference images not displayed]

2  US MFM UA CORD DOPPLER                76820.02    [place]
                                                      MORE

Indications

 Maternal care for known or suspected poor
 fetal growth, third trimester, fetus 1 IUGR
 Obesity complicating pregnancy, third
 trimester
 Small for gestational age fetus affecting
 management of mother
 37 weeks gestation of pregnancy
 Marijuana Use
 Other mental disorder complicating
 pregnancy, third trimester
Fetal Evaluation

 Num Of Fetuses:         1
 Fetal Heart Rate(bpm):  134
 Cardiac Activity:       Observed
 Presentation:           Cephalic
 Placenta:               Posterior
 P. Cord Insertion:      Previously Visualized

 Amniotic Fluid
 AFI FV:      Within normal limits

 AFI Sum(cm)     %Tile       Largest Pocket(cm)
 25.9            96

 RUQ(cm)       RLQ(cm)       LUQ(cm)        LLQ(cm)

Biophysical Evaluation

 Amniotic F.V:   Pocket => 2 cm             F. Tone:        Observed
 F. Movement:    Observed                   Score:          [DATE]
 F. Breathing:   Observed
OB History

 Gravidity:    1
Gestational Age

 Clinical EDD:  37w 1d                                        EDD:   10/18/19
 Best:          37w 1d     Det. By:  Clinical EDD             EDD:   10/18/19
Anatomy

 Diaphragm:             Appears normal         Bladder:                Visualized
 Stomach:               Visualized
Doppler - Fetal Vessels

 Umbilical Artery
  S/D     %tile      RI                                      ADFV    RDFV
  2.28       46    0.56                                         No      No

Comments

 This patient was seen due to an IUGR fetus.  She denies any
 problems since her last exam.
 A biophysical profile performed today was [DATE].
 Polyhydramnios with a overall AFI of over 25 cm was noted
 today.
 Doppler studies of the umbilical arteries performed due to
 fetal growth restriction showed a normal S/D ratio of 2.28.
 There were no signs of absent or reversed end-diastolic flow
 noted today.
 A follow-up exam was scheduled in 1 week.

## 2021-12-11 ENCOUNTER — Other Ambulatory Visit: Payer: Self-pay | Admitting: Allergy and Immunology

## 2022-03-14 ENCOUNTER — Other Ambulatory Visit: Payer: Self-pay | Admitting: Allergy and Immunology

## 2022-04-05 ENCOUNTER — Other Ambulatory Visit: Payer: Self-pay | Admitting: Allergy and Immunology

## 2022-04-08 NOTE — Telephone Encounter (Signed)
Patient is requesting a refill on Symbicort sent to [facility] in [place].

## 2022-04-14 ENCOUNTER — Encounter: Payer: Self-pay | Admitting: Allergy and Immunology

## 2022-04-14 ENCOUNTER — Ambulatory Visit (INDEPENDENT_AMBULATORY_CARE_PROVIDER_SITE_OTHER): Payer: Medicaid Other | Admitting: Allergy and Immunology

## 2022-04-14 VITALS — BP 114/84 | HR 100 | Temp 97.7°F | Resp 20

## 2022-04-14 DIAGNOSIS — J4551 Severe persistent asthma with (acute) exacerbation: Secondary | ICD-10-CM | POA: Diagnosis not present

## 2022-04-14 DIAGNOSIS — J3089 Other allergic rhinitis: Secondary | ICD-10-CM | POA: Diagnosis not present

## 2022-04-14 MED ORDER — ALBUTEROL SULFATE (2.5 MG/3ML) 0.083% IN NEBU: 2.5000 mg | INHALATION_SOLUTION | RESPIRATORY_TRACT | 1 refills | Status: DC | PRN

## 2022-04-14 MED ORDER — VENTOLIN HFA 108 (90 BASE) MCG/ACT IN AERS
2.0000 | INHALATION_SPRAY | RESPIRATORY_TRACT | 1 refills | Status: DC | PRN
Start: 2022-04-14 — End: 2022-06-11

## 2022-04-14 NOTE — Patient Instructions (Addendum)
  1.  Treat and prevent inflammation:   A. Benralizumab injections  B. Symbicort 160 - 2 inhalations 1-2 times per day  2.  If needed:   A.  ProAir HFA 2 inhalations or nebulization every 4-6 hours  B.  OTC antihistamine - loratadine/ Cetirizine 10 mg -1 tablet 1 time per day   3. For this recent event:  A. Prednisone 10 mg - 1 tablet 1 time per day for 10 days only B. Breztri sample - 2 inhalations 2 times per day (replaces Symbicort this week)  C. OTC Mucinex DM - 2 times per day  3. Return to clinic in 12 months or earlier if problem

## 2022-04-14 NOTE — Progress Notes (Signed)
[place] - [place] - [place] - [place] - [place]   Follow-up Note  Referring Provider: No ref. provider found Primary Provider: Patient, No Pcp Per Date of Office Visit: 04/14/2022  Subjective:   Tracy [name] (DOB: Jan 12, 1996) is a 26 y.o. female who [facility] in re-evaluation of the following:  HPI: Tracy [name] to this clinic in evaluation of asthma and allergic rhinitis.  I last saw her in this clinic 15 April 2021.  She was doing wonderful while using benralizumab injections every 8 weeks and Symbicort 1 time per day and had very little problems with either her upper or lower airway and rarely required albuterol and did not require systemic steroid or antibiotic for any type of airway issue.  Unfortunately, a cold came into the household and infected her son and husband and 1 week ago she developed runny nose and nasal congestion.  Even though her nose is improved she now has lots of coughing and wheezing and shortness of breath.  She has not had any associated fever or constitutional symptoms or anosmia or ugly nasal discharge or chest pain or sputum production.  She went to the urgent care center 3 days ago and was given 60 mg of prednisone which increased her anxiety significantly and she did not take any more prednisone after that point in time.  Allergies as of 04/14/2022   No Known Allergies      Medication List    Ventolin HFA 108 (90 Base) MCG/ACT inhaler Generic drug: albuterol INHALE 2 PUFFS BY MOUTH EVERY 4 TO 6 HOURS AS NEEDED FOR  COUGH  OR  WHEEZE   albuterol (2.5 MG/3ML) 0.083% nebulizer solution Commonly known as: PROVENTIL Take 2.5 mg by nebulization every 6 (six) hours as needed for wheezing or shortness of breath.   Fasenra Pen 30 MG/ML [place] Generic drug: Benralizumab INJECT 1 PEN (30 MG) UNDER THE SKIN AT WEEK 8 THEN EVERY 8 WEEKS THEREAFTER   ketoconazole 2 % shampoo Commonly known as:  NIZORAL U SHAMPOO QOD UTD   Mirena (52 MG) 20 MCG/DAY Iud Generic drug: levonorgestrel   predniSONE 10 MG (21) Tbpk tablet Commonly known as: STERAPRED UNI-PAK 21 TAB See admin instructions.   promethazine-dextromethorphan 6.25-15 MG/5ML syrup Commonly known as: PROMETHAZINE-DM Take 5 mLs by mouth at bedtime as needed.   sertraline 100 MG tablet Commonly known as: Zoloft Take 1.5 tablets (150 mg total) by mouth daily.   Symbicort 160-4.5 MCG/ACT inhaler Generic drug: budesonide-formoterol INHALE TWO PUFFS USING SPACER ONE TO TWO TIMES DAILY TO PREVENT COUGH OR WHEEZING. RINSE, GARGLE, AND SPIT AFTER USE.    Past Medical History:  Diagnosis Date   Abdominal pain    Anxiety    Asthma    Complication of anesthesia    panic attack    Depression    IUGR (intrauterine growth restriction) affecting care of mother    IUGR, antenatal    Polyhydramnios affecting pregnancy     Past Surgical History:  Procedure Laterality Date   ADENOIDECTOMY     CHOLECYSTECTOMY  05/2016   ESOPHAGOGASTRODUODENOSCOPY N/A 08/12/2013   Procedure: ESOPHAGOGASTRODUODENOSCOPY (EGD);  Surgeon: Jon Gills, MD;  Location: [facility] ENDOSCOPY;  Service: Endoscopy;  Laterality: N/A;   TYMPANOSTOMY TUBE PLACEMENT      Review of systems negative except as noted in HPI / PMHx or noted below:  Review of Systems  Constitutional: Negative.   HENT: Negative.    Eyes: Negative.  Respiratory: Negative.    Cardiovascular: Negative.   Gastrointestinal: Negative.   Genitourinary: Negative.   Musculoskeletal: Negative.   Skin: Negative.   Neurological: Negative.   Endo/Heme/Allergies: Negative.   Psychiatric/Behavioral: Negative.       Objective:   Vitals:   04/14/22 1008  BP: 114/84  Pulse: 100  Resp: 20  Temp: 97.7 F (36.5 C)  SpO2: 97%          Physical Exam Constitutional:      Appearance: She is not diaphoretic.  HENT:     Head: Normocephalic.     Right Ear: Tympanic membrane, ear  canal and external ear normal.     Left Ear: Tympanic membrane, ear canal and external ear normal.     Nose: Nose normal. No mucosal edema or rhinorrhea.     Mouth/Throat:     Pharynx: Uvula midline. No oropharyngeal exudate.  Eyes:     Conjunctiva/sclera: Conjunctivae normal.  Neck:     Thyroid: No thyromegaly.     Trachea: Trachea normal. No tracheal tenderness or tracheal deviation.  Cardiovascular:     Rate and Rhythm: Normal rate and regular rhythm.     Heart sounds: Normal heart sounds, S1 normal and S2 normal. No murmur heard. Pulmonary:     Effort: No respiratory distress.     Breath sounds: No stridor. Wheezing present. No rales.  Lymphadenopathy:     Head:     Right side of head: No tonsillar adenopathy.     Left side of head: No tonsillar adenopathy.     Cervical: No cervical adenopathy.  Skin:    Findings: No erythema or rash.     Nails: There is no clubbing.  Neurological:     Mental Status: She is alert.     Diagnostics: none  Assessment and Plan:   1. Asthma, not well controlled, severe persistent, with acute exacerbation   2. Perennial allergic rhinitis     1.  Treat and prevent inflammation:   A. Benralizumab injections  B. Symbicort 160 - 2 inhalations 1-2 times per day  2.  If needed:   A.  ProAir HFA 2 inhalations or nebulization every 4-6 hours  B.  OTC antihistamine - loratadine/ Cetirizine 10 mg -1 tablet 1 time per day   3. For this recent event:  A. Prednisone 10 mg - 1 tablet 1 time per day for 10 days only B. Breztri sample - 2 inhalations 2 times per day (replaces Symbicort this week)  C. OTC Mucinex DM - 2 times per day  3. Return to clinic in 12 months or earlier if problem  Tracy [name] has significant inflammation of her airway most likely secondary to her viral respiratory tract infection and we will give her a sample of triple inhaler today at the same time she uses a low-dose of systemic steroid as she is intolerant of using  high-dose systemic steroid.  Overall prior to this infection she was doing very well and she will remain on benralizumab and a dose of Symbicort which was working great to control her multiorgan atopic disease.  Assuming she does well I will see her back in this clinic in 12 weeks or earlier if there is a problem.  Allena Katz, MD Allergy / Immunology [place]

## 2022-04-15 ENCOUNTER — Encounter: Payer: Self-pay | Admitting: Allergy and Immunology

## 2022-05-05 ENCOUNTER — Other Ambulatory Visit: Payer: Self-pay | Admitting: Allergy and Immunology

## 2022-05-31 ENCOUNTER — Other Ambulatory Visit: Payer: Self-pay | Admitting: Allergy and Immunology

## 2022-06-11 ENCOUNTER — Other Ambulatory Visit: Payer: Self-pay | Admitting: *Deleted

## 2022-06-11 MED ORDER — VENTOLIN HFA 108 (90 BASE) MCG/ACT IN AERS: 2.0000 | INHALATION_SPRAY | RESPIRATORY_TRACT | 1 refills | Status: DC | PRN

## 2022-07-18 ENCOUNTER — Telehealth: Payer: Self-pay | Admitting: Allergy and Immunology

## 2022-07-18 NOTE — Telephone Encounter (Signed)
[facility] states a prior authorization is needed for [employer]. Pharmacy states they would like this back dated for 07-01-22.   [phone]

## 2022-07-21 NOTE — Telephone Encounter (Signed)
Too early to do PA same INs and approval on file till 08/17/22

## 2022-08-11 ENCOUNTER — Other Ambulatory Visit: Payer: Self-pay | Admitting: Allergy and Immunology

## 2022-10-02 ENCOUNTER — Telehealth: Payer: Self-pay | Admitting: Allergy and Immunology

## 2022-10-02 MED ORDER — FLUTICASONE-SALMETEROL 250-50 MCG/ACT IN AEPB: 1.0000 | INHALATION_SPRAY | Freq: Two times a day (BID) | RESPIRATORY_TRACT | 0 refills | Status: DC

## 2022-10-02 NOTE — Telephone Encounter (Signed)
Spoke with pt and [facility] [place]. Pt will let us know if she has any issues.

## 2022-10-02 NOTE — Telephone Encounter (Signed)
Patient states she is currently in between insurances and right now she is uninsured. She is in need of her Symbicort inhaler but it costs too much out of pocket. She is wanting to know if we can send in a cheaper alternative or if we would have a sample in office she can have in the meantime.   Best pharmacy-  [facility] in [place]

## 2022-10-02 NOTE — Telephone Encounter (Signed)
We unfortunately do not have any samples comparable to the Symbicort she is on. It looks like she can get Advair diskus [facility] for [facility]00. If this is affordable she could do Advair 250/50 1 puff twice a day. Rinse mouth out after

## 2022-10-02 NOTE — Telephone Encounter (Signed)
Tried calling pt no answer and unable to leave voice mail

## 2022-11-03 ENCOUNTER — Telehealth: Payer: Self-pay | Admitting: Allergy and Immunology

## 2022-11-03 MED ORDER — SYMBICORT 160-4.5 MCG/ACT IN AERO: INHALATION_SPRAY | RESPIRATORY_TRACT | 5 refills | Status: DC

## 2022-11-03 MED ORDER — FLUTICASONE FUROATE-VILANTEROL 200-25 MCG/ACT IN AEPB: 1.0000 | INHALATION_SPRAY | Freq: Every day | RESPIRATORY_TRACT | 5 refills | Status: DC

## 2022-11-03 NOTE — Addendum Note (Signed)
Addended by: Deborra Medina on: 11/03/2022 04:48 PM   Modules accepted: Orders

## 2022-11-03 NOTE — Telephone Encounter (Signed)
[facility].

## 2022-11-03 NOTE — Telephone Encounter (Signed)
Symbicort is not covered, insurance prefers [place]. Okay to switch? If so, which strength?

## 2022-11-03 NOTE — Telephone Encounter (Signed)
Patient is requesting Symbicort to be [facility] [place] on [address].

## 2022-11-03 NOTE — Telephone Encounter (Signed)
Per Dr. Lucie Leather ok to send Breo 200 1 inhalation 1 time per day.   Patient informed and rx sent.

## 2022-11-13 ENCOUNTER — Telehealth: Payer: Self-pay | Admitting: Allergy and Immunology

## 2022-11-13 MED ORDER — VENTOLIN HFA 108 (90 BASE) MCG/ACT IN AERS: 2.0000 | INHALATION_SPRAY | RESPIRATORY_TRACT | 1 refills | Status: DC | PRN

## 2022-11-13 NOTE — Telephone Encounter (Signed)
[facility]

## 2022-11-13 NOTE — Telephone Encounter (Signed)
Patient is requesting a refill on her Albuterol inhaler [facility] [address] in [place]

## 2022-11-25 ENCOUNTER — Encounter: Payer: Self-pay | Admitting: Allergy and Immunology

## 2022-11-25 ENCOUNTER — Other Ambulatory Visit: Payer: Self-pay

## 2022-11-25 ENCOUNTER — Ambulatory Visit (INDEPENDENT_AMBULATORY_CARE_PROVIDER_SITE_OTHER): Payer: BC Managed Care – PPO | Admitting: Allergy and Immunology

## 2022-11-25 VITALS — BP 120/88 | HR 84 | Temp 98.7°F | Ht 61.5 in | Wt 172.0 lb

## 2022-11-25 DIAGNOSIS — J3089 Other allergic rhinitis: Secondary | ICD-10-CM

## 2022-11-25 DIAGNOSIS — J4551 Severe persistent asthma with (acute) exacerbation: Secondary | ICD-10-CM | POA: Diagnosis not present

## 2022-11-25 NOTE — Progress Notes (Signed)
[place] - [place] - [place] - [place] -    Follow-up Note  Referring Provider: No ref. provider found Primary Provider: Patient, No Pcp Per Date of Office Visit: 11/25/2022  Subjective:   Tracy [name] (DOB: Aug 29, 1995) is a 27 y.o. female who [facility] in re-evaluation of the following:  HPI: Tracy [name] returns to this clinic in evaluation of severe asthma and allergic rhinitis.  I last saw her in this clinic 14 April 2022.  Her insurance company has denied the use of benralizumab injections and has denied the use of her Symbicort.  Thus, over the course of the past month or so she has been using her short acting bronchodilator 4 times per day, and has been having nocturnal bronchospastic symptoms.  Fortunately, she has not required a systemic steroid to treat an exacerbation.  She has had very little problems with her upper airways.  Allergies as of 11/25/2022   No Known Allergies      Medication List    albuterol (2.5 MG/3ML) 0.083% nebulizer solution Commonly known as: PROVENTIL Take 3 mLs (2.5 mg total) by nebulization every 4 (four) hours as needed for wheezing or shortness of breath.   Ventolin HFA 108 (90 Base) MCG/ACT inhaler Generic drug: albuterol Inhale 2 puffs into the lungs every 4 (four) hours as needed for wheezing or shortness of breath.   Fasenra Pen 30 MG/ML prefilled autoinjector Generic drug: benralizumab INJECT 1 PEN (30 MG) UNDER THE SKIN AT WEEK 8 THEN EVERY 8 WEEKS THEREAFTER   fluticasone furoate-vilanterol 200-25 MCG/ACT Aepb Commonly known as: Breo Ellipta Inhale 1 puff into the lungs daily.   fluticasone-salmeterol 250-50 MCG/ACT Aepb Commonly known as: Advair Diskus Inhale 1 puff into the lungs in the morning and at bedtime.   ketoconazole 2 % shampoo Commonly known as: NIZORAL   Mirena (52 MG) 20 MCG/DAY Iud Generic drug: levonorgestrel   predniSONE 10 MG (21) Tbpk  tablet Commonly known as: STERAPRED UNI-PAK 21 TAB See admin instructions.   promethazine-dextromethorphan 6.25-15 MG/5ML syrup Commonly known as: PROMETHAZINE-DM Take 5 mLs by mouth at bedtime as needed.   sertraline 100 MG tablet Commonly known as: Zoloft Take 1.5 tablets (150 mg total) by mouth daily.   Symbicort 160-4.5 MCG/ACT inhaler Generic drug: budesonide-formoterol INHALE 2 PUFFS BY MOUTH USING SPACER ONE TO TWO TIMES DAILY TO PREVENT COUGH OR WHEEZE. RINSE, GARGLE, AND SPIT AFTER USE    Past Medical History:  Diagnosis Date   Abdominal pain    Anxiety    Asthma    Complication of anesthesia    panic attack    Depression    IUGR (intrauterine growth restriction) affecting care of mother    IUGR, antenatal    Polyhydramnios affecting pregnancy     Past Surgical History:  Procedure Laterality Date   ADENOIDECTOMY     CHOLECYSTECTOMY  05/2016   ESOPHAGOGASTRODUODENOSCOPY N/A 08/12/2013   Procedure: ESOPHAGOGASTRODUODENOSCOPY (EGD);  Surgeon: Jon Gills, MD;  Location: [facility] ENDOSCOPY;  Service: Endoscopy;  Laterality: N/A;   TYMPANOSTOMY TUBE PLACEMENT      Review of systems negative except as noted in HPI / PMHx or noted below:  Review of Systems  Constitutional: Negative.   HENT: Negative.    Eyes: Negative.   Respiratory: Negative.    Cardiovascular: Negative.   Gastrointestinal: Negative.   Genitourinary: Negative.   Musculoskeletal: Negative.   Skin: Negative.   Neurological: Negative.   Endo/Heme/Allergies: Negative.  Psychiatric/Behavioral: Negative.       Objective:   Vitals:   11/25/22 1211  BP: 120/88  Pulse: 84  Temp: 98.7 F (37.1 C)  SpO2: 96%   Height: 5' 1.5" (156.2 cm)  Weight: 172 lb (78 kg)   Physical Exam Constitutional:      Appearance: She is not diaphoretic.  HENT:     Head: Normocephalic.     Right Ear: Tympanic membrane, ear canal and external ear normal.     Left Ear: Tympanic membrane, ear canal and external  ear normal.     Nose: Nose normal. No mucosal edema or rhinorrhea.     Mouth/Throat:     Pharynx: Uvula midline. No oropharyngeal exudate.  Eyes:     Conjunctiva/sclera: Conjunctivae normal.  Neck:     Thyroid: No thyromegaly.     Trachea: Trachea normal. No tracheal tenderness or tracheal deviation.  Cardiovascular:     Rate and Rhythm: Normal rate and regular rhythm.     Heart sounds: Normal heart sounds, S1 normal and S2 normal. No murmur heard. Pulmonary:     Effort: No respiratory distress.     Breath sounds: Normal breath sounds. No stridor. No wheezing or rales.  Lymphadenopathy:     Head:     Right side of head: No tonsillar adenopathy.     Left side of head: No tonsillar adenopathy.     Cervical: No cervical adenopathy.  Skin:    Findings: No erythema or rash.     Nails: There is no clubbing.  Neurological:     Mental Status: She is alert.     Diagnostics:    Spirometry was performed and demonstrated an FEV1 of 2.82 at 95 % of predicted.  Assessment and Plan:   1. Asthma, not well controlled, severe persistent, with acute exacerbation   2. Perennial allergic rhinitis     1.  Treat and prevent inflammation:   A. Benralizumab injections - [name] will submit to insurance  B. DULERA 200 HFA - 2 inhalations 2 times per day (empty lungs)  2.  If needed:   A.  AIRSUPRA - 2 inhalations or nebulization every 4-6 hours (Coupon)  B.  OTC antihistamine - loratadine/ Cetirizine 10 mg -1 tablet 1 time per day   3. Plan for fall flu vaccine  4. Return to clinic in 12 months or earlier if problem  We need to get Tuere back on her anti-IL-5 biologic agent given the severity of her asthma and we need to replace her Symbicort with [facility] which hopefully will be covered by her insurance company.  I given her an anti-inflammatory rescue medicine to use as well.  If she does well with this plan I will see her back in this clinic in 1 year or earlier if there is a  problem.  [name], MD [facility]ergy / Immunology  [facility]

## 2022-11-25 NOTE — Patient Instructions (Signed)
  1.  Treat and prevent inflammation:   A. Benralizumab injections - [name] will submit to insurance  B. DULERA 200 HFA - 2 inhalations 2 times per day (empty lungs)  2.  If needed:   A.  AIRSUPRA - 2 inhalations or nebulization every 4-6 hours (Coupon)  B.  OTC antihistamine - loratadine/ Cetirizine 10 mg -1 tablet 1 time per day   3. Plan for fall flu vaccine  4. Return to clinic in 12 months or earlier if problem

## 2022-12-01 ENCOUNTER — Encounter: Payer: Self-pay | Admitting: Allergy and Immunology

## 2022-12-15 ENCOUNTER — Other Ambulatory Visit: Payer: Self-pay | Admitting: *Deleted

## 2022-12-15 MED ORDER — DULERA 200-5 MCG/ACT IN AERO: 2.0000 | INHALATION_SPRAY | Freq: Two times a day (BID) | RESPIRATORY_TRACT | 5 refills | Status: DC

## 2022-12-15 MED ORDER — AIRSUPRA 90-80 MCG/ACT IN AERO: 2.0000 | INHALATION_SPRAY | RESPIRATORY_TRACT | 1 refills | Status: DC | PRN

## 2022-12-30 NOTE — Patient Instructions (Incomplete)
  1.  Treat and prevent inflammation:   A. Benralizumab injections - [name] will submit to insurance  B. DULERA 200 HFA - 2 inhalations 2 times per day (empty lungs)  2.  If needed:   A.  AIRSUPRA - 2 inhalations or nebulization every 4-6 hours (Coupon)  B.  OTC antihistamine - loratadine/ Cetirizine 10 mg -1 tablet 1 time per day   3. Plan for fall flu vaccine  4. Return to clinic in 12 months or earlier if problem

## 2022-12-31 ENCOUNTER — Other Ambulatory Visit: Payer: Self-pay

## 2022-12-31 ENCOUNTER — Encounter: Payer: Self-pay | Admitting: Family

## 2022-12-31 ENCOUNTER — Ambulatory Visit (INDEPENDENT_AMBULATORY_CARE_PROVIDER_SITE_OTHER): Payer: BC Managed Care – PPO | Admitting: Family

## 2022-12-31 VITALS — BP 118/70 | HR 95 | Temp 98.7°F | Resp 16 | Wt 171.4 lb

## 2022-12-31 DIAGNOSIS — J3089 Other allergic rhinitis: Secondary | ICD-10-CM | POA: Diagnosis not present

## 2022-12-31 DIAGNOSIS — J4551 Severe persistent asthma with (acute) exacerbation: Secondary | ICD-10-CM | POA: Diagnosis not present

## 2022-12-31 MED ORDER — AIRSUPRA 90-80 MCG/ACT IN AERO: 2.0000 | INHALATION_SPRAY | RESPIRATORY_TRACT | 1 refills | Status: DC | PRN

## 2022-12-31 MED ORDER — PREDNISONE 10 MG PO TABS: ORAL_TABLET | ORAL | 0 refills | Status: DC
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
## 2023-01-01 LAB — CBC WITH DIFFERENTIAL/PLATELET
Basophils Absolute: 0.1 10*3/uL (ref 0.0–0.2)
Basos: 1 %
EOS (ABSOLUTE): 0.4 10*3/uL (ref 0.0–0.4)
Eos: 4 %
Hematocrit: 41.2 % (ref 34.0–46.6)
Hemoglobin: 13.9 g/dL (ref 11.1–15.9)
Immature Grans (Abs): 0 10*3/uL (ref 0.0–0.1)
Immature Granulocytes: 0 %
Lymphocytes Absolute: 2.5 10*3/uL (ref 0.7–3.1)
Lymphs: 29 %
MCH: 28.5 pg (ref 26.6–33.0)
MCHC: 33.7 g/dL (ref 31.5–35.7)
MCV: 84 fL (ref 79–97)
Monocytes Absolute: 0.6 10*3/uL (ref 0.1–0.9)
Monocytes: 7 %
Neutrophils Absolute: 5.1 10*3/uL (ref 1.4–7.0)
Neutrophils: 59 %
Platelets: 323 10*3/uL (ref 150–450)
RBC: 4.88 x10E6/uL (ref 3.77–5.28)
RDW: 13.1 % (ref 11.7–15.4)
WBC: 8.7 10*3/uL (ref 3.4–10.8)

## 2023-01-01 NOTE — Progress Notes (Signed)
Please let Tracy [name] know that her lab work qualifies her for [employer].  I am sending a message to [name], our biologics coordinator, so she can try to get approval with your insurance.

## 2023-01-12 ENCOUNTER — Telehealth: Payer: Self-pay | Admitting: *Deleted

## 2023-01-12 NOTE — Telephone Encounter (Signed)
Thanks [name] 

## 2023-01-12 NOTE — Telephone Encounter (Signed)
Called patient and advised approval, copay card and submit to [employer] for Fasenra. Reviewed delivery,dosing with patient

## 2023-01-12 NOTE — Telephone Encounter (Signed)
-----   Message from Nehemiah Settle sent at 12/31/2022  3:56 PM EDT ----- Patient would like to restart Fasenra for severe persistent asthma. Cbc with diff completed today in the office to help qualify

## 2023-03-26 ENCOUNTER — Other Ambulatory Visit: Payer: Self-pay | Admitting: Family

## 2023-04-07 ENCOUNTER — Ambulatory Visit: Payer: BC Managed Care – PPO | Admitting: Allergy and Immunology

## 2023-04-13 ENCOUNTER — Ambulatory Visit: Payer: Medicaid Other | Admitting: Allergy and Immunology

## 2023-05-01 ENCOUNTER — Other Ambulatory Visit: Payer: Self-pay | Admitting: Family

## 2023-05-22 ENCOUNTER — Other Ambulatory Visit: Payer: Self-pay | Admitting: Family

## 2023-06-07 ENCOUNTER — Other Ambulatory Visit: Payer: Self-pay | Admitting: Family

## 2023-07-02 ENCOUNTER — Ambulatory Visit: Payer: BC Managed Care – PPO | Admitting: Allergy and Immunology

## 2023-07-05 NOTE — Progress Notes (Deleted)
 Follow Up Note  RE: Tracy [name] MRN: [id number] DOB: 1996-03-25 Date of Office Visit: 07/06/2023  Referring provider: No ref. provider found Primary care provider: Patient, No Pcp Per  Chief Complaint: No chief complaint on file.  History of Present Illness: I had the pleasure of seeing Tracy [name] for a follow up visit at the [facility] on 07/05/2023. She is a 28 y.o. female, who is being followed for asthma. Her previous allergy office visit was on 12/23/2022 with Tracy Settle FNP. Today is a regular follow up visit.  Discussed the use of AI scribe software for clinical note transcription with the patient, who gave verbal consent to proceed.  History of Present Illness         This patient on [facility] Challenger now?   1.  Treat and prevent inflammation:               A. Benralizumab injections - Tracy [name] will submit to insurance. We will get an up to date cbc with diff to help re-qualify you for benralizumab             B. DULERA 200 HFA - 2 inhalations 2 times per day (empty lungs)             C. Start prednisone 10 mg taking 2 tablets twice a day for 3 days, then on the 4th day take 2 tablets in the morning, and on the 5th day take one tablet and stop. Use your albuterol if needed while taking prednisone. Then once of prednisone use Airsupra in place of albuterol.   2.  If needed:               A.  AIRSUPRA - 2 inhalations or nebulization every 4-6 hours (Coupon). Do not exceed 12 puffs in 24 hours             B.  OTC antihistamine - loratadine/ Cetirizine 10 mg -1 tablet 1 time per day    3. Plan for fall flu vaccine  Assessment and Plan: Tracy [name] is a 28 y.o. female with: *** Assessment and Plan              No follow-ups on file.  No orders of the defined types were placed in this encounter.  Lab Orders  No laboratory test(s) ordered today    Diagnostics: Spirometry:  Tracings reviewed. Her effort: {Blank single:19197::"Good reproducible  efforts.","It was hard to get consistent efforts and there is a question as to whether this reflects a maximal maneuver.","Poor effort, data can not be interpreted."} FVC: ***L FEV1: ***L, ***% predicted FEV1/FVC ratio: ***% Interpretation: {Blank single:19197::"Spirometry consistent with mild obstructive disease","Spirometry consistent with moderate obstructive disease","Spirometry consistent with severe obstructive disease","Spirometry consistent with possible restrictive disease","Spirometry consistent with mixed obstructive and restrictive disease","Spirometry uninterpretable due to technique","Spirometry consistent with normal pattern","No overt abnormalities noted given today's efforts"}.  Please see scanned spirometry results for details.  Skin Testing: {Blank single:19197::"Select foods","Environmental allergy panel","Environmental allergy panel and select foods","Food allergy panel","None","Deferred due to recent antihistamines use"}. *** Results discussed with patient/family.   Medication List:  Current Outpatient Medications  Medication Sig Dispense Refill   albuterol (PROVENTIL) (2.5 MG/3ML) 0.083% nebulizer solution Take 3 mLs (2.5 mg total) by nebulization every 4 (four) hours as needed for wheezing or shortness of breath. 75 mL 1   Albuterol-Budesonide (AIRSUPRA) 90-80 MCG/ACT AERO INHALE 2 PUFFS BY MOUTH EVERY 4 HOURS AS NEEDED 10.7 g 1   FASENRA PEN 30  MG/ML SOAJ INJECT 1 PEN (30 MG) UNDER THE SKIN AT WEEK 8 THEN EVERY 8 WEEKS THEREAFTER (Patient not taking: Reported on 11/25/2022) 1 mL 6   ketoconazole (NIZORAL) 2 % shampoo   5   levonorgestrel (MIRENA, 52 MG,) 20 MCG/DAY IUD      mometasone-formoterol (DULERA) 200-5 MCG/ACT AERO Inhale 2 puffs into the lungs 2 (two) times daily. 1 each 5   predniSONE (DELTASONE) 10 MG tablet Take 2 tablets twice a day for 3 days,then on the 4th day take 2 tablets in the morning, and on the 5th day take 1 tablet and stop 15 tablet 0    promethazine-dextromethorphan (PROMETHAZINE-DM) 6.25-15 MG/5ML syrup Take 5 mLs by mouth at bedtime as needed.     sertraline (ZOLOFT) 100 MG tablet Take 1.5 tablets (150 mg total) by mouth daily.     VENTOLIN HFA 108 (90 Base) MCG/ACT inhaler Inhale 2 puffs into the lungs every 4 (four) hours as needed for wheezing or shortness of breath. 18 g 1   Current Facility-Administered Medications  Medication Dose Route Frequency Provider Last Rate Last Admin   Benralizumab SOSY 30 mg  30 mg Subcutaneous Q28 days Kozlow, Alvira Philips, MD   30 mg at 12/26/19 1113   Allergies: No Known Allergies I reviewed her past medical history, social history, family history, and environmental history and no significant changes have been reported from her previous visit.  Review of Systems  Constitutional:  Negative for appetite change, chills, fever and unexpected weight change.  HENT:  Negative for congestion and rhinorrhea.   Eyes:  Negative for itching.  Respiratory:  Negative for cough, chest tightness, shortness of breath and wheezing.   Cardiovascular:  Negative for chest pain.  Gastrointestinal:  Negative for abdominal pain.  Genitourinary:  Negative for difficulty urinating.  Skin:  Negative for rash.  Neurological:  Negative for headaches.    Objective: There were no vitals taken for this visit. There is no height or weight on file to calculate BMI. Physical Exam Vitals and nursing note reviewed.  Constitutional:      Appearance: Normal appearance. She is well-developed.  HENT:     Head: Normocephalic and atraumatic.     Right Ear: Tympanic membrane and external ear normal.     Left Ear: Tympanic membrane and external ear normal.     Nose: Nose normal.     Mouth/Throat:     Mouth: Mucous membranes are moist.     Pharynx: Oropharynx is clear.  Eyes:     Conjunctiva/sclera: Conjunctivae normal.  Cardiovascular:     Rate and Rhythm: Normal rate and regular rhythm.     Heart sounds: Normal heart  sounds. No murmur heard.    No friction rub. No gallop.  Pulmonary:     Effort: Pulmonary effort is normal.     Breath sounds: Normal breath sounds. No wheezing, rhonchi or rales.  Musculoskeletal:     Cervical back: Neck supple.  Skin:    General: Skin is warm.     Findings: No rash.  Neurological:     Mental Status: She is alert and oriented to person, place, and time.  Psychiatric:        Behavior: Behavior normal.    Previous notes and tests were reviewed. The plan was reviewed with the patient/family, and all questions/concerned were addressed.  It was my pleasure to see Emilija today and participate in her care. Please feel free to contact me with any questions or concerns.  Sincerely,  Wyline Mood, DO Allergy & Immunology  [facility] office: [phone] [facility] office: [phone]

## 2023-07-06 ENCOUNTER — Ambulatory Visit: Payer: BC Managed Care – PPO | Admitting: Allergy

## 2023-07-12 ENCOUNTER — Other Ambulatory Visit: Payer: Self-pay | Admitting: Family

## 2023-08-18 ENCOUNTER — Ambulatory Visit (INDEPENDENT_AMBULATORY_CARE_PROVIDER_SITE_OTHER): Admitting: Allergy and Immunology

## 2023-08-18 VITALS — BP 108/82 | HR 100 | Temp 99.2°F | Resp 12 | Ht 61.02 in | Wt 162.0 lb

## 2023-08-18 DIAGNOSIS — J3089 Other allergic rhinitis: Secondary | ICD-10-CM

## 2023-08-18 DIAGNOSIS — J4550 Severe persistent asthma, uncomplicated: Secondary | ICD-10-CM

## 2023-08-18 MED ORDER — AIRSUPRA 90-80 MCG/ACT IN AERO: 2.0000 | INHALATION_SPRAY | RESPIRATORY_TRACT | 1 refills | Status: DC | PRN

## 2023-08-18 MED ORDER — NEBULIZER MASK ADULT MISC: 1.0000 | 1 refills | Status: AC

## 2023-08-18 MED ORDER — CETIRIZINE HCL 10 MG PO TABS: 10.0000 mg | ORAL_TABLET | Freq: Every day | ORAL | 1 refills | Status: AC | PRN

## 2023-08-18 MED ORDER — ALBUTEROL SULFATE (2.5 MG/3ML) 0.083% IN NEBU: 2.5000 mg | INHALATION_SOLUTION | RESPIRATORY_TRACT | 1 refills | Status: AC | PRN

## 2023-08-18 MED ORDER — LORATADINE 10 MG PO TABS: 10.0000 mg | ORAL_TABLET | Freq: Every day | ORAL | 1 refills | Status: AC | PRN

## 2023-08-18 NOTE — Progress Notes (Unsigned)
  - [name] - [place] - [place] - [place]   Follow-up Note  Referring Provider: No ref. provider found Primary Provider: Patient, No Pcp Per Date of Office Visit: 08/18/2023  Subjective:   Tracy [name] (DOB: 18-Apr-1996) is a 28 y.o. female who [facility] on 08[facility]18/2023 in re-evaluation of the following:  HPI: [name] presents to this clinic in evaluation of asthma and allergic rhinitis.  I last saw her in this clinic 25 November 2022.  She did visit with our nurse practitioner 31 December 2022.  Since that last visit she has done relatively well without the need for systemic steroid or antibiotic to treat any type of airway issue but with a Airsupra requirement of at least 1 time per day.  She is no longer using any controller agent secondary to an insurance issue as the insurance company would not pay for her [facility].  She has had very little issues with her nose while occasionally using a antihistamine.  Allergies as of 08/18/2023   No Known Allergies      Medication List    Airsupra 90-80 MCG/ACT Aero Generic drug: Albuterol-Budesonide TAKE 2 PUFFS BY MOUTH EVERY 4 HOURS AS NEEDED   albuterol (2.5 MG/3ML) 0.083% nebulizer solution Commonly known as: PROVENTIL Take 3 mLs (2.5 mg total) by nebulization every 4 (four) hours as needed for wheezing or shortness of breath.   Fasenra Pen 30 MG/ML prefilled autoinjector Generic drug: benralizumab INJECT 1 PEN (30 MG) UNDER THE SKIN AT WEEK 8 THEN EVERY 8 WEEKS THEREAFTER   Mirena (52 MG) 20 MCG/DAY Iud Generic drug: levonorgestrel   sertraline 100 MG tablet Commonly known as: Zoloft Take 1.5 tablets (150 mg total) by mouth daily.    Past Medical History:  Diagnosis Date   Abdominal pain    Anxiety    Asthma    Complication of anesthesia    panic attack    Depression    IUGR (intrauterine growth restriction) affecting care of mother    IUGR, antenatal    Polyhydramnios  affecting pregnancy     Past Surgical History:  Procedure Laterality Date   ADENOIDECTOMY     CHOLECYSTECTOMY  05/2016   ESOPHAGOGASTRODUODENOSCOPY N/A 08/12/2013   Procedure: ESOPHAGOGASTRODUODENOSCOPY (EGD);  Surgeon: Jon Gills, MD;  Location: [facility] ENDOSCOPY;  Service: Endoscopy;  Laterality: N/A;   TYMPANOSTOMY TUBE PLACEMENT      Review of systems negative except as noted in HPI / PMHx or noted below:  Review of Systems  Constitutional: Negative.   HENT: Negative.    Eyes: Negative.   Respiratory: Negative.    Cardiovascular: Negative.   Gastrointestinal: Negative.   Genitourinary: Negative.   Musculoskeletal: Negative.   Skin: Negative.   Neurological: Negative.   Endo/Heme/Allergies: Negative.   Psychiatric/Behavioral: Negative.       Objective:   Vitals:   08/18/23 1352  BP: 108/82  Pulse: 100  Resp: 12  Temp: 99.2 F (37.3 C)  SpO2: 98%   Height: 5' 1.02" (155 cm)  Weight: 162 lb (73.5 kg)   Physical Exam Constitutional:      Appearance: She is not diaphoretic.  HENT:     Head: Normocephalic.     Right Ear: Tympanic membrane, ear canal and external ear normal.     Left Ear: Tympanic membrane, ear canal and external ear normal.     Nose: Nose normal. No mucosal edema or rhinorrhea.     Mouth/Throat:  Pharynx: Uvula midline. No oropharyngeal exudate.  Eyes:     Conjunctiva/sclera: Conjunctivae normal.  Neck:     Thyroid: No thyromegaly.     Trachea: Trachea normal. No tracheal tenderness or tracheal deviation.  Cardiovascular:     Rate and Rhythm: Normal rate and regular rhythm.     Heart sounds: Normal heart sounds, S1 normal and S2 normal. No murmur heard. Pulmonary:     Effort: No respiratory distress.     Breath sounds: Normal breath sounds. No stridor. No wheezing or rales.  Lymphadenopathy:     Head:     Right side of head: No tonsillar adenopathy.     Left side of head: No tonsillar adenopathy.     Cervical: No cervical  adenopathy.  Skin:    Findings: No erythema or rash.     Nails: There is no clubbing.  Neurological:     Mental Status: She is alert.     Diagnostics:    Spirometry was performed and demonstrated an FEV1 of 2.95 at 100 % of predicted.  Assessment and Plan:   1. Not well controlled severe persistent asthma   2. Perennial allergic rhinitis     1.  Treat and prevent inflammation:   A. Benralizumab injections   B. DULERA substitute - 2 inhalations 2 times per day (empty lungs)  2.  If needed:   A.  AIRSUPRA - 2 inhalations or nebulization every 4-6 hours   B.  OTC antihistamine - loratadine/ Cetirizine 10 mg -1 tablet 1 time per day   3. Influenza = Tamiflu. Covid = Paxlovid  4. Return to clinic in 6 months or earlier if problem  [name] really needs to use a controller agent and will need to find a substitute for Dulera that her insurance company will cover and we will start her on that controller agent utilized on a consistent basis while she also continues on benralizumab injections.  Certainly she can use her anti-inflammatory rescue medicine if it is required.  Assuming she does well with this plan I will see her back in this clinic in 6 months or earlier if there is a problem.  Schuyler Custard, MD Allergy / Immunology [facility]

## 2023-08-18 NOTE — Patient Instructions (Addendum)
  1.  Treat and prevent inflammation:   A. Benralizumab injections   B. DULERA substitute - 2 inhalations 2 times per day (empty lungs)  2.  If needed:   A.  AIRSUPRA - 2 inhalations or nebulization every 4-6 hours   B.  OTC antihistamine - loratadine/ Cetirizine 10 mg -1 tablet 1 time per day   3. Influenza = Tamiflu. Covid = Paxlovid  4. Return to clinic in 6 months or earlier if problem

## 2023-08-19 ENCOUNTER — Encounter: Payer: Self-pay | Admitting: Allergy and Immunology

## 2023-08-24 ENCOUNTER — Other Ambulatory Visit: Payer: Self-pay | Admitting: Family

## 2023-10-20 ENCOUNTER — Other Ambulatory Visit: Payer: Self-pay | Admitting: Allergy and Immunology

## 2023-11-24 ENCOUNTER — Other Ambulatory Visit: Payer: Self-pay | Admitting: Family

## 2023-12-06 ENCOUNTER — Other Ambulatory Visit: Payer: Self-pay | Admitting: Allergy and Immunology

## 2023-12-07 ENCOUNTER — Other Ambulatory Visit: Payer: Self-pay | Admitting: *Deleted

## 2023-12-07 MED ORDER — AIRSUPRA 90-80 MCG/ACT IN AERO: 2.0000 | INHALATION_SPRAY | Freq: Four times a day (QID) | RESPIRATORY_TRACT | 1 refills | Status: DC | PRN

## 2024-01-20 ENCOUNTER — Other Ambulatory Visit: Payer: Self-pay | Admitting: Allergy and Immunology

## 2024-02-14 ENCOUNTER — Other Ambulatory Visit: Payer: Self-pay | Admitting: Allergy and Immunology

## 2024-02-16 ENCOUNTER — Ambulatory Visit (INDEPENDENT_AMBULATORY_CARE_PROVIDER_SITE_OTHER): Admitting: Allergy and Immunology

## 2024-02-16 ENCOUNTER — Other Ambulatory Visit: Payer: Self-pay

## 2024-02-16 ENCOUNTER — Encounter: Payer: Self-pay | Admitting: Allergy and Immunology

## 2024-02-16 VITALS — BP 118/90 | HR 81 | Temp 98.4°F | Resp 20 | Ht 61.2 in | Wt 157.9 lb

## 2024-02-16 DIAGNOSIS — J3089 Other allergic rhinitis: Secondary | ICD-10-CM

## 2024-02-16 DIAGNOSIS — J4550 Severe persistent asthma, uncomplicated: Secondary | ICD-10-CM

## 2024-02-16 MED ORDER — AIRSUPRA 90-80 MCG/ACT IN AERO: 2.0000 | INHALATION_SPRAY | Freq: Four times a day (QID) | RESPIRATORY_TRACT | 0 refills | Status: DC | PRN

## 2024-02-16 NOTE — Patient Instructions (Addendum)
  1.  Treat and prevent inflammation:   A. Benralizumab  injections   B. Wixela 250 - 1 inhalations 2 times per day (empty lungs)  2.  If needed:   A.  AIRSUPRA  - 2 inhalations or nebulization every 4-6 hours   B.  OTC antihistamine    3. Influenza = Tamiflu. Covid = Paxlovid  4. Return to clinic in 6 months or earlier if problem

## 2024-02-16 NOTE — Progress Notes (Unsigned)
 [place] - [place] - [place] - [place] - [place]   Follow-up Note  Referring Provider: No ref. provider found Primary Provider: Patient, No Pcp Per Date of Office Visit: 02/16/2024  Subjective:   Tracy [name] (DOB: 1996-03-03) is a 28 y.o. female who [facility] on 02/16/2024 in re-evaluation of the following:  HPI: Tracy [name] returns to this clinic in evaluation of asthma and allergic rhinitis.  I last saw her in this clinic 18 August 2023.  She has not required a systemic steroid or an antibiotic for any type of airway issue and overall she thinks that she is doing quite well but she still has a requirement to use her Airsupra  twice a day.  Rarely does she have any issues with exerting herself.  She unfortunately is not using an inhaled controller agent at this point in time.  She does not receive the flu vaccine.  Allergies as of 02/16/2024   No Known Allergies      Medication List    Airsupra  90-80 MCG/ACT Aero Generic drug: Albuterol -Budesonide  Inhale 2 puffs into the lungs every 6 (six) hours as needed. INHALE 2 PUFFS BY MOUTH INTO THE LUNGS AS NEEDED (EVERY 4-6 HOURS FOR COUGH, WHEEZE, SHORTNESS OF BREATH. RINSE, GARGLE, AND SPIT AFTER USE).   albuterol  (2.5 MG/3ML) 0.083% nebulizer solution Commonly known as: PROVENTIL  Take 3 mLs (2.5 mg total) by nebulization every 4 (four) hours as needed for wheezing or shortness of breath.   cetirizine  10 MG tablet Commonly known as: ZYRTEC  Take 1 tablet (10 mg total) by mouth daily as needed (Can take an extra dose during flare ups.).   Fasenra  Pen 30 MG/ML prefilled autoinjector Generic drug: benralizumab  INJECT 1 PEN UNDER THE SKIN EVERY 8 WEEKS   loratadine  10 MG tablet Commonly known as: Claritin  Take 1 tablet (10 mg total) by mouth daily as needed for allergies (Can ytake an extra dose during flare ups.).   Mirena  (52 MG) 20 MCG/DAY Iud Generic drug: levonorgestrel    Nebulizer  Mask Adult Misc 1 Device by Does not apply route as directed.   sertraline  100 MG tablet Commonly known as: Zoloft  Take 1.5 tablets (150 mg total) by mouth daily.    Past Medical History:  Diagnosis Date   Abdominal pain    Anxiety    Asthma    Complication of anesthesia    panic attack    Depression    IUGR (intrauterine growth restriction) affecting care of mother    IUGR, antenatal    Polyhydramnios affecting pregnancy     Past Surgical History:  Procedure Laterality Date   ADENOIDECTOMY     CHOLECYSTECTOMY  05/2016   ESOPHAGOGASTRODUODENOSCOPY N/A 08/12/2013   Procedure: ESOPHAGOGASTRODUODENOSCOPY (EGD);  Surgeon: Fairy VEAR Gaskins, MD;  Location: [facility] ENDOSCOPY;  Service: Endoscopy;  Laterality: N/A;   TYMPANOSTOMY TUBE PLACEMENT      Review of systems negative except as noted in HPI / PMHx or noted below:  Review of Systems  Constitutional: Negative.   HENT: Negative.    Eyes: Negative.   Respiratory: Negative.    Cardiovascular: Negative.   Gastrointestinal: Negative.   Genitourinary: Negative.   Musculoskeletal: Negative.   Skin: Negative.   Neurological: Negative.   Endo/Heme/Allergies: Negative.   Psychiatric/Behavioral: Negative.       Objective:   Vitals:   02/16/24 1352  BP: (!) 118/90  Pulse: 81  Resp: 20  Temp: 98.4 F (36.9 C)  SpO2: 96%   Height:  5' 1.2 (155.4 cm)  Weight: 157 lb 14.4 oz (71.6 kg)   Physical Exam Constitutional:      Appearance: She is not diaphoretic.  HENT:     Head: Normocephalic.     Right Ear: Tympanic membrane, ear canal and external ear normal.     Left Ear: Tympanic membrane, ear canal and external ear normal.     Nose: Nose normal. No mucosal edema or rhinorrhea.     Mouth/Throat:     Pharynx: Uvula midline. No oropharyngeal exudate.  Eyes:     Conjunctiva/sclera: Conjunctivae normal.  Neck:     Thyroid: No thyromegaly.     Trachea: Trachea normal. No tracheal tenderness or tracheal deviation.   Cardiovascular:     Rate and Rhythm: Normal rate and regular rhythm.     Heart sounds: Normal heart sounds, S1 normal and S2 normal. No murmur heard. Pulmonary:     Effort: No respiratory distress.     Breath sounds: Normal breath sounds. No stridor. No wheezing or rales.  Lymphadenopathy:     Head:     Right side of head: No tonsillar adenopathy.     Left side of head: No tonsillar adenopathy.     Cervical: No cervical adenopathy.  Skin:    Findings: No erythema or rash.     Nails: There is no clubbing.  Neurological:     Mental Status: She is alert.     Diagnostics: Spirometry was performed and demonstrated an FEV1 of 2.65 at 90 % of predicted.  Assessment and Plan:   1. Asthma, severe persistent, well-controlled (HCC)   2. Perennial allergic rhinitis    1.  Treat and prevent inflammation:   A. Benralizumab  injections   B. Wixela 250 - 1 inhalations 2 times per day (empty lungs)  2.  If needed:   A.  AIRSUPRA  - 2 inhalations or nebulization every 4-6 hours   B.  OTC antihistamine    3. Influenza = Tamiflu. Covid = Paxlovid  4. Return to clinic in 6 months or earlier if problem  Tracy [name] appears to have pretty good control of her asthma while using benralizumab  injections but she still has a requirement for short acting bronchodilator twice a day and I am going to give her a controller agent to use at least 1 or 2 times per day.  If she does well with this plan I will see her back in this clinic in 6 months or earlier if there is a problem.   Camellia Denis, MD Allergy / Immunology [facility]

## 2024-02-17 ENCOUNTER — Encounter: Payer: Self-pay | Admitting: Allergy and Immunology

## 2024-04-05 ENCOUNTER — Other Ambulatory Visit: Payer: Self-pay | Admitting: Allergy and Immunology

## 2024-04-05 DIAGNOSIS — J4550 Severe persistent asthma, uncomplicated: Secondary | ICD-10-CM

## 2024-05-19 ENCOUNTER — Other Ambulatory Visit: Payer: Self-pay | Admitting: Allergy and Immunology

## 2024-05-19 DIAGNOSIS — J4550 Severe persistent asthma, uncomplicated: Secondary | ICD-10-CM

## 2024-08-16 ENCOUNTER — Ambulatory Visit: Admitting: Allergy and Immunology
